# Patient Record
Sex: Female | Born: 1985 | Race: Black or African American | Hispanic: No | Marital: Single | Smoking: Light tobacco smoker
Health system: Southern US, Community
[De-identification: ages and names within clinical notes are randomized; demographics above are authoritative.]

## PROBLEM LIST (undated history)

## (undated) DIAGNOSIS — B009 Herpesviral infection, unspecified: Secondary | ICD-10-CM

## (undated) HISTORY — PX: FOOT SURGERY: SHX648

---

## 2015-10-01 ENCOUNTER — Emergency Department (HOSPITAL_COMMUNITY): Payer: Medicaid Other

## 2015-10-01 ENCOUNTER — Emergency Department (HOSPITAL_COMMUNITY)
Admission: EM | Admit: 2015-10-01 | Discharge: 2015-10-01 | Disposition: A | Discharge status: Home / Self Care | Payer: Medicaid Other | Attending: Emergency Medicine | Admitting: Emergency Medicine

## 2015-10-01 ENCOUNTER — Encounter (HOSPITAL_COMMUNITY): Payer: Self-pay | Admitting: Emergency Medicine

## 2015-10-01 DIAGNOSIS — Z91040 Latex allergy status: Secondary | ICD-10-CM | POA: Insufficient documentation

## 2015-10-01 DIAGNOSIS — R102 Pelvic and perineal pain: Secondary | ICD-10-CM

## 2015-10-01 DIAGNOSIS — F17210 Nicotine dependence, cigarettes, uncomplicated: Secondary | ICD-10-CM | POA: Insufficient documentation

## 2015-10-01 DIAGNOSIS — N939 Abnormal uterine and vaginal bleeding, unspecified: Secondary | ICD-10-CM

## 2015-10-01 DIAGNOSIS — N760 Acute vaginitis: Secondary | ICD-10-CM | POA: Insufficient documentation

## 2015-10-01 DIAGNOSIS — N390 Urinary tract infection, site not specified: Secondary | ICD-10-CM | POA: Diagnosis not present

## 2015-10-01 DIAGNOSIS — B9689 Other specified bacterial agents as the cause of diseases classified elsewhere: Secondary | ICD-10-CM

## 2015-10-01 HISTORY — DX: Herpesviral infection, unspecified: B00.9

## 2015-10-01 LAB — WET PREP, GENITAL
Sperm: NONE SEEN
Trich, Wet Prep: NONE SEEN
WBC, Wet Prep HPF POC: NONE SEEN
Yeast Wet Prep HPF POC: NONE SEEN

## 2015-10-01 LAB — COMPREHENSIVE METABOLIC PANEL
ALBUMIN: 3.6 g/dL (ref 3.5–5.0)
ALK PHOS: 52 U/L (ref 38–126)
ALT: 21 U/L (ref 14–54)
AST: 29 U/L (ref 15–41)
Anion gap: 5 (ref 5–15)
BILIRUBIN TOTAL: 0.5 mg/dL (ref 0.3–1.2)
BUN: 15 mg/dL (ref 6–20)
CALCIUM: 8.4 mg/dL — AB (ref 8.9–10.3)
CO2: 26 mmol/L (ref 22–32)
CREATININE: 0.7 mg/dL (ref 0.44–1.00)
Chloride: 107 mmol/L (ref 101–111)
GFR calc Af Amer: >60 mL/min (ref >60)
GFR calc non Af Amer: >60 mL/min (ref >60)
GLUCOSE: 84 mg/dL (ref 65–99)
POTASSIUM: 3.6 mmol/L (ref 3.5–5.1)
Sodium: 138 mmol/L (ref 135–145)
TOTAL PROTEIN: 6.7 g/dL (ref 6.5–8.1)

## 2015-10-01 LAB — CBC WITH DIFFERENTIAL/PLATELET
BASOS ABS: 0 10*3/uL (ref 0.0–0.1)
BASOS PCT: 0 %
EOS ABS: 0.1 10*3/uL (ref 0.0–0.7)
Eosinophils Relative: 1 %
HEMATOCRIT: 34.7 % — AB (ref 36.0–46.0)
HEMOGLOBIN: 11.6 g/dL — AB (ref 12.0–15.0)
LYMPHS ABS: 1.3 10*3/uL (ref 0.7–4.0)
Lymphocytes Relative: 15 %
MCH: 30.3 pg (ref 26.0–34.0)
MCHC: 33.4 g/dL (ref 30.0–36.0)
MCV: 90.6 fL (ref 78.0–100.0)
Monocytes Absolute: 0.6 10*3/uL (ref 0.1–1.0)
Monocytes Relative: 7 %
NEUTROS PCT: 77 %
Neutro Abs: 6.7 10*3/uL (ref 1.7–7.7)
Platelets: 256 10*3/uL (ref 150–400)
RBC: 3.83 MIL/uL — AB (ref 3.87–5.11)
RDW: 12.9 % (ref 11.5–15.5)
WBC: 8.7 10*3/uL (ref 4.0–10.5)

## 2015-10-01 LAB — URINALYSIS, ROUTINE W REFLEX MICROSCOPIC
Bilirubin Urine: NEGATIVE
GLUCOSE, UA: NEGATIVE mg/dL
KETONES UR: NEGATIVE mg/dL
Nitrite: POSITIVE — AB
PH: 7 (ref 5.0–8.0)
Protein, ur: 100 mg/dL — AB
Specific Gravity, Urine: 1.025 (ref 1.005–1.030)

## 2015-10-01 LAB — URINE MICROSCOPIC-ADD ON

## 2015-10-01 LAB — LIPASE, BLOOD: Lipase: 23 U/L (ref 11–51)

## 2015-10-01 LAB — I-STAT BETA HCG BLOOD, ED (MC, WL, AP ONLY)

## 2015-10-01 MED ORDER — METRONIDAZOLE 500 MG PO TABS: 500.0000 mg | ORAL_TABLET | Freq: Two times a day (BID) | ORAL | Status: DC

## 2015-10-01 MED ORDER — NAPROXEN 500 MG PO TABS: 500.0000 mg | ORAL_TABLET | Freq: Two times a day (BID) | ORAL | Status: DC

## 2015-10-01 MED ORDER — ACETAMINOPHEN 325 MG PO TABS
650.0000 mg | ORAL_TABLET | Freq: Once | ORAL | Status: AC
  Administered 2015-10-01: 650 mg via ORAL
  Filled 2015-10-01: qty 2

## 2015-10-01 MED ORDER — CEPHALEXIN 500 MG PO CAPS: 500.0000 mg | ORAL_CAPSULE | Freq: Two times a day (BID) | ORAL | Status: DC

## 2015-10-01 NOTE — ED Provider Notes (Signed)
CSN: [id number]     Arrival date & time 10/01/15  1022 History   First MD Initiated Contact with Patient 10/01/15 1136     Chief Complaint  Patient presents with  . Vaginal Bleeding  . Pelvic Pain     (Consider location/radiation/quality/duration/timing/severity/associated sxs/prior Treatment) HPI Miranda Stephenson is a 30 y.o. female with PMH significant for Herpes who presents with gradual onset, intermittent, light vaginal bleeding since 12 PM yesterday.  She states she noticed small blood clots and thick clumps whih prompted her visit today.  Associated symptoms include sharp pelvic pain, dysuria, urinary frequency, and generalized weakness.  Denies fever, chills, hematuria, vaginal discharge, dyspareunia, flank pain, N/V/D.  She does not take OCPs.  Tubal ligation 7 years ago.  Sexually active with one partner.  LNMP ended 09/26/15.  Past Medical History  Diagnosis Date  . Herpes    Past Surgical History  Procedure Laterality Date  . Cesarean section    . Foot surgery     No family history on file. Social History  Substance Use Topics  . Smoking status: Light Tobacco Smoker    Types: Cigarettes  . Smokeless tobacco: None  . Alcohol Use: Yes   OB History    No data available     Review of Systems All other systems negative unless otherwise stated in HPI    Allergies  Latex  Home Medications   Prior to Admission medications   Medication Sig Start Date End Date Taking? Authorizing Provider  Multiple Vitamin (MULTIVITAMIN WITH MINERALS) TABS tablet Take 1 tablet by mouth daily.   Yes Historical Provider, MD  naproxen sodium (ANAPROX) 220 MG tablet Take 220-440 mg by mouth 2 (two) times daily as needed (pain).   Yes Historical Provider, MD   BP 111/75 mmHg  Pulse 67  Temp(Src) 98.6 F (37 C) (Oral)  Resp 18  SpO2 100%  LMP 09/26/2015 Physical Exam  Constitutional: She is oriented to person, place, and time. She appears well-developed and well-nourished.   Non-toxic appearance. She does not have a sickly appearance. She does not appear ill.  HENT:  Head: Normocephalic and atraumatic.  Eyes: Conjunctivae are normal. Pupils are equal, round, and reactive to light.  Neck: Normal range of motion. Neck supple.  Cardiovascular: Normal rate and regular rhythm.   Pulmonary/Chest: Effort normal and breath sounds normal. No accessory muscle usage or stridor. No respiratory distress. She has no wheezes. She has no rhonchi. She has no rales.  Abdominal: Soft. Bowel sounds are normal. She exhibits no distension. There is tenderness in the suprapubic area. There is no rigidity, no rebound, no guarding and no CVA tenderness.  Genitourinary: Vagina normal and uterus normal. There is no rash, tenderness or lesion on the right labia. There is no rash, tenderness or lesion on the left labia. Cervix exhibits discharge (white chunky). Cervix exhibits no motion tenderness. Right adnexum displays no tenderness. Left adnexum displays no tenderness.  Musculoskeletal: Normal range of motion.  Lymphadenopathy:    She has no cervical adenopathy.  Neurological: She is alert and oriented to person, place, and time.  Speech clear without dysarthria.  Skin: Skin is warm and dry.  Psychiatric: She has a normal mood and affect. Her behavior is normal.    ED Course  Procedures (including critical care time) Labs Review Labs Reviewed  WET PREP, GENITAL - Abnormal; Notable for the following:    Clue Cells Wet Prep HPF POC PRESENT (*)    All other components within normal  limits  CBC WITH DIFFERENTIAL/PLATELET - Abnormal; Notable for the following:    RBC 3.83 (*)    Hemoglobin 11.6 (*)    HCT 34.7 (*)    All other components within normal limits  COMPREHENSIVE METABOLIC PANEL - Abnormal; Notable for the following:    Calcium 8.4 (*)    All other components within normal limits  URINALYSIS, ROUTINE W REFLEX MICROSCOPIC (NOT AT [facility]) - Abnormal; Notable for the following:     APPearance CLOUDY (*)    Hgb urine dipstick LARGE (*)    Protein, ur 100 (*)    Nitrite POSITIVE (*)    Leukocytes, UA SMALL (*)    All other components within normal limits  URINE MICROSCOPIC-ADD ON - Abnormal; Notable for the following:    Squamous Epithelial / LPF 0-5 (*)    Bacteria, UA MANY (*)    All other components within normal limits  URINE CULTURE  LIPASE, BLOOD  RPR  HIV ANTIBODY (ROUTINE TESTING)  I-STAT BETA HCG BLOOD, ED ([facility], [facility], [facility] ONLY)  GC/CHLAMYDIA PROBE AMP ([place]) NOT AT [facility]    Imaging Review [place] Transvaginal Non-ob  10/01/2015  CLINICAL DATA:  Pelvic pain since yesterday. Evaluate for ovarian torsion. EXAM: TRANSABDOMINAL AND TRANSVAGINAL ULTRASOUND OF PELVIS DOPPLER ULTRASOUND OF OVARIES TECHNIQUE: Both transabdominal and transvaginal ultrasound examinations of the pelvis were performed. Transabdominal technique was performed for global imaging of the pelvis including uterus, ovaries, adnexal regions, and pelvic cul-de-sac. It was necessary to proceed with endovaginal exam following the transabdominal exam to visualize the ovaries. Color and duplex Doppler ultrasound was utilized to evaluate blood flow to the ovaries. COMPARISON:  None. FINDINGS: Uterus Measurements: 8.2 x 4.3 x 5.3 cm. No fibroids or other mass visualized. Endometrium Thickness: 0.9 cm.  No focal abnormality visualized. Right ovary Measurements: 2.7 x 1.7 x 2.2 cm. Normal appearance/no adnexal mass. Left ovary Measurements: 3.8 x 2.2 x 3.1 cm. Normal appearance/no adnexal mass. Pulsed Doppler evaluation of both ovaries demonstrates normal low-resistance arterial and venous waveforms. Other findings No abnormal free fluid. IMPRESSION: Normal pelvic ultrasound.  Negative for ovarian torsion. Electronically Signed   By: Richarda OverlieAdam  Henn M.D.   On: 10/01/2015 14:06   [place] Pelvis Complete  10/01/2015  CLINICAL DATA:  Pelvic pain since yesterday. Evaluate for ovarian torsion. EXAM: TRANSABDOMINAL AND  TRANSVAGINAL ULTRASOUND OF PELVIS DOPPLER ULTRASOUND OF OVARIES TECHNIQUE: Both transabdominal and transvaginal ultrasound examinations of the pelvis were performed. Transabdominal technique was performed for global imaging of the pelvis including uterus, ovaries, adnexal regions, and pelvic cul-de-sac. It was necessary to proceed with endovaginal exam following the transabdominal exam to visualize the ovaries. Color and duplex Doppler ultrasound was utilized to evaluate blood flow to the ovaries. COMPARISON:  None. FINDINGS: Uterus Measurements: 8.2 x 4.3 x 5.3 cm. No fibroids or other mass visualized. Endometrium Thickness: 0.9 cm.  No focal abnormality visualized. Right ovary Measurements: 2.7 x 1.7 x 2.2 cm. Normal appearance/no adnexal mass. Left ovary Measurements: 3.8 x 2.2 x 3.1 cm. Normal appearance/no adnexal mass. Pulsed Doppler evaluation of both ovaries demonstrates normal low-resistance arterial and venous waveforms. Other findings No abnormal free fluid. IMPRESSION: Normal pelvic ultrasound.  Negative for ovarian torsion. Electronically Signed   By: Richarda OverlieAdam  Henn M.D.   On: 10/01/2015 14:06   [place] Art/ven Flow Abd Pelv Doppler  10/01/2015  CLINICAL DATA:  Pelvic pain since yesterday. Evaluate for ovarian torsion. EXAM: TRANSABDOMINAL AND TRANSVAGINAL ULTRASOUND OF PELVIS DOPPLER ULTRASOUND OF OVARIES TECHNIQUE: Both transabdominal and  transvaginal ultrasound examinations of the pelvis were performed. Transabdominal technique was performed for global imaging of the pelvis including uterus, ovaries, adnexal regions, and pelvic cul-de-sac. It was necessary to proceed with endovaginal exam following the transabdominal exam to visualize the ovaries. Color and duplex Doppler ultrasound was utilized to evaluate blood flow to the ovaries. COMPARISON:  None. FINDINGS: Uterus Measurements: 8.2 x 4.3 x 5.3 cm. No fibroids or other mass visualized. Endometrium Thickness: 0.9 cm.  No focal abnormality visualized.  Right ovary Measurements: 2.7 x 1.7 x 2.2 cm. Normal appearance/no adnexal mass. Left ovary Measurements: 3.8 x 2.2 x 3.1 cm. Normal appearance/no adnexal mass. Pulsed Doppler evaluation of both ovaries demonstrates normal low-resistance arterial and venous waveforms. Other findings No abnormal free fluid. IMPRESSION: Normal pelvic ultrasound.  Negative for ovarian torsion. Electronically Signed   By: Richarda Overlie M.D.   On: 10/01/2015 14:06   I have personally reviewed and evaluated these images and lab results as part of my medical decision-making.   EKG Interpretation None      MDM   Final diagnoses:  Pelvic pain in female  UTI (lower urinary tract infection)  BV (bacterial vaginosis)  Vaginal bleeding   Patient presents with vaginal bleeding and pelvic pain.  VSS, NAD.  Associated symptoms include dysuria, urinary frequency.  No fever, N/V/D.  On exam, patient appears well, non-toxic or septic.  Heart RRR, lungs CTAB, abdomen soft and benign with mild suprapubic tenderness.  No rebound, guarding, or rigidity.  No CMT or adnexal tenderness.  There is white chunky cervical discharge.  Wet prep shows clue cells. She is not pregnant. Hemoglobin 11.6. No leukocytosis. CMP without acute abnormalities. UA shows large hemoglobin, 6-30 rbc's, many bacteria, nitrite positive. Urine culture sent. HIV, RPR, GC/Chlamydia obtained.  Ultrasound unremarkable, no evidence of ovarian torsion. Discharge home with Keflex and Flagyl.  Follow up [facility].  Discussed return precautions.  Patient agrees and acknowledges the above plan for discharge.   Case has been discussed with Dr. Jeraldine Loots who agrees with the above plan for discharge.         Cheri Fowler, PA-C 10/01/15 1431  Gerhard Munch, MD 10/01/15 1538

## 2015-10-01 NOTE — ED Notes (Signed)
Pt c/o vaginal bleeding and pain since last night. Pt states she has been off her period for 5 days when the bleeding started. States there were "lots of small blood clots and it was thick like mucus."

## 2015-10-01 NOTE — ED Notes (Signed)
Pt transported to [facility]

## 2015-10-01 NOTE — Discharge Instructions (Signed)
Abnormal Uterine Bleeding °Abnormal uterine bleeding can affect women at various stages in life, including teenagers, women in their reproductive years, pregnant women, and women who have reached menopause. Several kinds of uterine bleeding are considered abnormal, including: °· Bleeding or spotting between periods.   °· Bleeding after sexual intercourse.   °· Bleeding that is heavier or more than normal.   °· Periods that last longer than usual. °· Bleeding after menopause.   °Many cases of abnormal uterine bleeding are minor and simple to treat, while others are more serious. Any type of abnormal bleeding should be evaluated by your health care provider. Treatment will depend on the cause of the bleeding. °HOME CARE INSTRUCTIONS °Monitor your condition for any changes. The following actions may help to alleviate any discomfort you are experiencing: °· Avoid the use of tampons and douches as directed by your health care provider. °· Change your pads frequently. °You should get regular pelvic exams and Pap tests. Keep all follow-up appointments for diagnostic tests as directed by your health care provider.  °SEEK MEDICAL CARE IF:  °· Your bleeding lasts more than 1 week.   °· You feel dizzy at times.   °SEEK IMMEDIATE MEDICAL CARE IF:  °· You pass out.   °· You are changing pads every 15 to 30 minutes.   °· You have abdominal pain. °· You have a fever.   °· You become sweaty or weak.   °· You are passing large blood clots from the vagina.   °· You start to feel nauseous and vomit. °MAKE SURE YOU:  °· Understand these instructions. °· Will watch your condition. °· Will get help right away if you are not doing well or get worse. °  °This information is not intended to replace advice given to you by your health care provider. Make sure you discuss any questions you have with your health care provider. °  °Document Released: 04/03/2005 Document Revised: 04/08/2013 Document Reviewed: 10/31/2012 °Elsevier Interactive  Patient Education ©2016 [employer] ° °Urinary Tract Infection °Urinary tract infections (UTIs) can develop anywhere along your urinary tract. Your urinary tract is your body's drainage system for removing wastes and extra water. Your urinary tract includes two kidneys, two ureters, a bladder, and a urethra. Your kidneys are a pair of bean-shaped organs. Each kidney is about the size of your fist. They are located below your ribs, one on each side of your spine. °CAUSES °Infections are caused by microbes, which are microscopic organisms, including fungi, viruses, and bacteria. These organisms are so small that they can only be seen through a microscope. Bacteria are the microbes that most commonly cause UTIs. °SYMPTOMS  °Symptoms of UTIs may vary by age and gender of the patient and by the location of the infection. Symptoms in young women typically include a frequent and intense urge to urinate and a painful, burning feeling in the bladder or urethra during urination. Older women and men are more likely to be tired, shaky, and weak and have muscle aches and abdominal pain. A fever may mean the infection is in your kidneys. Other symptoms of a kidney infection include pain in your back or sides below the ribs, nausea, and vomiting. °DIAGNOSIS °To diagnose a UTI, your caregiver will ask you about your symptoms. Your caregiver will also ask you to provide a urine sample. The urine sample will be tested for bacteria and white blood cells. White blood cells are made by your body to help fight infection. °TREATMENT  °Typically, UTIs can be treated with medication. Because most UTIs   are caused by a bacterial infection, they usually can be treated with the use of antibiotics. The choice of antibiotic and length of treatment depend on your symptoms and the type of bacteria causing your infection. °HOME CARE INSTRUCTIONS °· If you were prescribed antibiotics, take them exactly as your caregiver instructs you. Finish the  medication even if you feel better after you have only taken some of the medication. °· Drink enough water and fluids to keep your urine clear or pale yellow. °· Avoid caffeine, tea, and carbonated beverages. They tend to irritate your bladder. °· Empty your bladder often. Avoid holding urine for long periods of time. °· Empty your bladder before and after sexual intercourse. °· After a bowel movement, women should cleanse from front to back. Use each tissue only once. °SEEK MEDICAL CARE IF:  °· You have back pain. °· You develop a fever. °· Your symptoms do not begin to resolve within 3 days. °SEEK IMMEDIATE MEDICAL CARE IF:  °· You have severe back pain or lower abdominal pain. °· You develop chills. °· You have nausea or vomiting. °· You have continued burning or discomfort with urination. °MAKE SURE YOU:  °· Understand these instructions. °· Will watch your condition. °· Will get help right away if you are not doing well or get worse. °  °This information is not intended to replace advice given to you by your health care provider. Make sure you discuss any questions you have with your health care provider. °  °Document Released: 01/11/2005 Document Revised: 12/23/2014 Document Reviewed: 05/12/2011 °Elsevier Interactive Patient Education ©2016 Elsevier Inc. ° °

## 2015-10-02 LAB — RPR: RPR: NONREACTIVE

## 2015-10-02 LAB — HIV ANTIBODY (ROUTINE TESTING W REFLEX): HIV Screen 4th Generation wRfx: NONREACTIVE

## 2015-10-04 LAB — GC/CHLAMYDIA PROBE AMP (~~LOC~~) NOT AT ARMC
Chlamydia: NEGATIVE
NEISSERIA GONORRHEA: NEGATIVE

## 2015-10-04 LAB — URINE CULTURE

## 2015-10-05 ENCOUNTER — Telehealth (HOSPITAL_BASED_OUTPATIENT_CLINIC_OR_DEPARTMENT_OTHER): Payer: Self-pay | Admitting: Emergency Medicine

## 2015-10-05 NOTE — Telephone Encounter (Signed)
Post ED Visit - Positive Culture Follow-up  Culture report reviewed by antimicrobial stewardship pharmacist:  []  Miranda Stephenson, Pharm.D. []  Miranda Stephenson, Pharm.D., BCPS []  Miranda Stephenson, Pharm.D. []  Miranda Stephenson, Pharm.D., BCPS []  Miranda Stephenson, [place] BCPS, AAHIVP []  Miranda Stephenson, Pharm.D., BCPS, AAHIVP [x]  Miranda Stephenson, Pharm.D. []  Miranda Stephenson, [address]  Positive urine  culture Treated with metronidazole and cephalexin, organism sensitive to the same and no further patient follow-up is required at this time.  Miranda Stephenson, Miranda Stephenson 10/05/2015, 9:48 AM

## 2015-10-25 ENCOUNTER — Encounter (HOSPITAL_COMMUNITY): Payer: Self-pay | Admitting: Emergency Medicine

## 2015-10-25 ENCOUNTER — Ambulatory Visit (HOSPITAL_COMMUNITY)
Admission: EM | Admit: 2015-10-25 | Discharge: 2015-10-25 | Disposition: A | Discharge status: Home / Self Care | Payer: Medicaid Other | Attending: Emergency Medicine | Admitting: Emergency Medicine

## 2015-10-25 DIAGNOSIS — K0889 Other specified disorders of teeth and supporting structures: Secondary | ICD-10-CM | POA: Diagnosis not present

## 2015-10-25 MED ORDER — HYDROCODONE-ACETAMINOPHEN 5-325 MG PO TABS: 1.0000 | ORAL_TABLET | ORAL | Status: DC | PRN

## 2015-10-25 MED ORDER — AMOXICILLIN 500 MG PO CAPS
1000.0000 mg | ORAL_CAPSULE | Freq: Two times a day (BID) | ORAL | Status: DC
Start: 2015-10-25 — End: 2017-10-01

## 2015-10-25 NOTE — ED Provider Notes (Signed)
CSN: [id number]     Arrival date & time 10/25/15  1024 History   First MD Initiated Contact with Patient 10/25/15 1044     Chief Complaint  Patient presents with  . Dental Pain   (Consider location/radiation/quality/duration/timing/severity/associated sxs/prior Treatment) HPI Comments: 30 year old female woke up with a toothache this morning. Pain radiates to the right upper jaw and ear. Denies actual ear pain. She states she has had a problem with molars in the right upper jaw in the past and [place]. She was told at that time that eventually she would be having pain in this area in the future. Denies fever or chills, sore throat or fevers.   Past Medical History  Diagnosis Date  . Herpes    Past Surgical History  Procedure Laterality Date  . Cesarean section    . Foot surgery     History reviewed. No pertinent family history. Social History  Substance Use Topics  . Smoking status: Light Tobacco Smoker    Types: Cigarettes  . Smokeless tobacco: None  . Alcohol Use: Yes   OB History    No data available     Review of Systems  Constitutional: Negative.   HENT: Positive for dental problem. Negative for ear pain, mouth sores and sore throat.   Eyes: Negative.   Respiratory: Negative.   Neurological: Negative.     Allergies  Latex  Home Medications   Prior to Admission medications   Medication Sig Start Date End Date Taking? Authorizing Provider  naproxen (NAPROSYN) 500 MG tablet Take 1 tablet (500 mg total) by mouth 2 (two) times daily. 10/01/15  Yes Kayla Rose, PA-C  amoxicillin (AMOXIL) 500 MG capsule Take 2 capsules (1,000 mg total) by mouth 2 (two) times daily. 10/25/15   Hayden Rasmussen, NP  cephALEXin (KEFLEX) 500 MG capsule Take 1 capsule (500 mg total) by mouth 2 (two) times daily. 10/01/15   Cheri Fowler, PA-C  HYDROcodone-acetaminophen (NORCO/VICODIN) 5-325 MG tablet Take 1 tablet by mouth every 4 (four) hours as needed. 10/25/15    Hayden Rasmussen, NP  metroNIDAZOLE (FLAGYL) 500 MG tablet Take 1 tablet (500 mg total) by mouth 2 (two) times daily. 10/01/15   Cheri Fowler, PA-C  Multiple Vitamin (MULTIVITAMIN WITH MINERALS) TABS tablet Take 1 tablet by mouth daily.    Historical Provider, MD  naproxen sodium (ANAPROX) 220 MG tablet Take 220-440 mg by mouth 2 (two) times daily as needed (pain).    Historical Provider, MD   Meds Ordered and Administered this Visit  Medications - No data to display  BP 100/61 mmHg  Pulse 67  Temp(Src) 98.7 F (37.1 C) (Oral)  Resp 18  SpO2 98%  LMP 09/26/2015 No data found.   Physical Exam  Constitutional: She is oriented to person, place, and time. She appears well-developed and well-nourished. No distress.  HENT:  Mouth/Throat: Oropharynx is clear and moist. No oropharyngeal exudate.  Fair to good Engineer, manufacturing systems. There is tenderness to the right upper second molar. No obvious signs of infection erythema or swelling.  Eyes: Conjunctivae and EOM are normal.  Neck: Neck supple.  Musculoskeletal: Normal range of motion.  Neurological: She is alert and oriented to person, place, and time. No cranial nerve deficit.  Skin: Skin is warm and dry.  Psychiatric: She has a normal mood and affect.  Nursing note and vitals reviewed.   ED Course  Procedures (including critical care time)  Labs Review Labs Reviewed - No  data to display  Imaging Review No results found.   Visual Acuity Review  Right Eye Distance:   Left Eye Distance:   Bilateral Distance:    Right Eye Near:   Left Eye Near:    Bilateral Near:         MDM   1. Pain, dental    Meds ordered this encounter  Medications  . amoxicillin (AMOXIL) 500 MG capsule    Sig: Take 2 capsules (1,000 mg total) by mouth 2 (two) times daily.    Dispense:  28 capsule    Refill:  0    Order Specific Question:  Supervising Provider    Answer:  Charm RingsHONIG, ERIN J [id number]]  . HYDROcodone-acetaminophen (NORCO/VICODIN) 5-325 MG tablet     Sig: Take 1 tablet by mouth every 4 (four) hours as needed.    Dispense:  15 tablet    Refill:  0    Order Specific Question:  Supervising Provider    Answer:  Charm RingsHONIG, ERIN J [[id number]]   Referred to dentist and PCP. Telephone numbers given to call in assistance with PCP. Resources for dentist given.    Hayden Rasmussenavid Jolaine Fryberger, NP 10/25/15 1056

## 2015-10-25 NOTE — ED Notes (Signed)
[facility] with a complaint of dental pain and an ear ache that started at 0600 this date.

## 2016-05-07 ENCOUNTER — Emergency Department (HOSPITAL_COMMUNITY)
Admission: EM | Admit: 2016-05-07 | Discharge: 2016-05-07 | Disposition: A | Discharge status: Home / Self Care | Payer: Medicaid Other | Attending: Emergency Medicine | Admitting: Emergency Medicine

## 2016-05-07 ENCOUNTER — Encounter (HOSPITAL_COMMUNITY): Payer: Self-pay | Admitting: Emergency Medicine

## 2016-05-07 DIAGNOSIS — J069 Acute upper respiratory infection, unspecified: Secondary | ICD-10-CM

## 2016-05-07 DIAGNOSIS — R05 Cough: Secondary | ICD-10-CM | POA: Diagnosis present

## 2016-05-07 DIAGNOSIS — R6889 Other general symptoms and signs: Secondary | ICD-10-CM

## 2016-05-07 DIAGNOSIS — Z91040 Latex allergy status: Secondary | ICD-10-CM | POA: Insufficient documentation

## 2016-05-07 DIAGNOSIS — F17210 Nicotine dependence, cigarettes, uncomplicated: Secondary | ICD-10-CM | POA: Insufficient documentation

## 2016-05-07 DIAGNOSIS — Z79899 Other long term (current) drug therapy: Secondary | ICD-10-CM | POA: Diagnosis not present

## 2016-05-07 MED ORDER — IBUPROFEN 600 MG PO TABS: 600.0000 mg | ORAL_TABLET | Freq: Four times a day (QID) | ORAL | 0 refills | Status: DC | PRN

## 2016-05-07 MED ORDER — ACETAMINOPHEN 500 MG PO TABS: 500.0000 mg | ORAL_TABLET | Freq: Four times a day (QID) | ORAL | 0 refills | Status: DC | PRN

## 2016-05-07 MED ORDER — BENZONATATE 100 MG PO CAPS: 100.0000 mg | ORAL_CAPSULE | Freq: Three times a day (TID) | ORAL | 0 refills | Status: DC

## 2016-05-07 MED ORDER — CETIRIZINE-PSEUDOEPHEDRINE ER 5-120 MG PO TB12: 1.0000 | ORAL_TABLET | Freq: Two times a day (BID) | ORAL | 0 refills | Status: DC

## 2016-05-07 NOTE — ED Triage Notes (Signed)
Pt from home with complaints of sneezing, congestion, cough, and generalized body aches that began Friday. Pt is not febrile nor is she tachycardic at time of assessment. Pt states she coughs up a scant amount of light colored sputum in the morning.

## 2016-05-07 NOTE — Discharge Instructions (Signed)
Medications: Tessalon, Zyrtec-D, ibuprofen, Tylenol  Treatment: Take Tessalon every 8 hours as needed for cough. Take Zyrtec-D twice daily for nasal congestion. You can alternate ibuprofen and Tylenol as prescribed or just choose one for your body aches and any fever. Make sure to drink plenty of water and get plenty of rest.  Follow-up: Please return to the emergency department if you develop and new or worsening symptoms such as chest pain, shortness of breath, fever over 100.4 that is not resolved with medicine or lasts more than 3 days, or any other concerning symptoms.

## 2016-05-07 NOTE — ED Provider Notes (Signed)
[facility]-EMERGENCY DEPT Provider Note   CSN: [id number] Arrival date & time: 05/07/16  1503  By signing my name below, I, Orpah Cobb, attest that this documentation has been prepared under the direction and in the presence of Buel Ream, PA-C. Electronically Signed: Orpah Cobb , ED Scribe. 05/01/16. 4:20 PM.   History   Chief Complaint Chief Complaint  Patient presents with  . Nasal Congestion  . Cough  . Generalized Body Aches    HPI  Miranda Stephenson is a 31 y.o. female who presents to the Emergency Department complaining of mild to moderate URI symptoms with sudden onset x2 days. Pt states that yesterday she developed a productive cough with associated sore throat. She states that she also had a fever that has since decreased. Pt reports congestion, sneezing and myalgias. She reports a single episode of vomiting yesterday that has since resolved. Pt has taken Robitussin and a breathing treatment with mild relief. She denies ear pain, eye pain at the moment, nausea, vomiting today, chest pain, or SOB.  The history is provided by the patient. No language interpreter was used.    Past Medical History:  Diagnosis Date  . Herpes     There are no active problems to display for this patient.   Past Surgical History:  Procedure Laterality Date  . CESAREAN SECTION    . FOOT SURGERY      OB History    No data available       Home Medications    Prior to Admission medications   Medication Sig Start Date End Date Taking? Authorizing Provider  acetaminophen (TYLENOL) 500 MG tablet Take 1 tablet (500 mg total) by mouth every 6 (six) hours as needed. 05/07/16   Emi Holes, PA-C  amoxicillin (AMOXIL) 500 MG capsule Take 2 capsules (1,000 mg total) by mouth 2 (two) times daily. 10/25/15   Hayden Rasmussen, NP  benzonatate (TESSALON) 100 MG capsule Take 1 capsule (100 mg total) by mouth every 8 (eight) hours. 05/07/16   Emi Holes, PA-C  cephALEXin (KEFLEX) 500 MG  capsule Take 1 capsule (500 mg total) by mouth 2 (two) times daily. 10/01/15   Kayla Rose, PA-C  cetirizine-pseudoephedrine (ZYRTEC-D) 5-120 MG tablet Take 1 tablet by mouth 2 (two) times daily. 05/07/16   Emi Holes, PA-C  HYDROcodone-acetaminophen (NORCO/VICODIN) 5-325 MG tablet Take 1 tablet by mouth every 4 (four) hours as needed. 10/25/15   Hayden Rasmussen, NP  ibuprofen (ADVIL,MOTRIN) 600 MG tablet Take 1 tablet (600 mg total) by mouth every 6 (six) hours as needed. 05/07/16   Emi Holes, PA-C  metroNIDAZOLE (FLAGYL) 500 MG tablet Take 1 tablet (500 mg total) by mouth 2 (two) times daily. 10/01/15   Cheri Fowler, PA-C  Multiple Vitamin (MULTIVITAMIN WITH MINERALS) TABS tablet Take 1 tablet by mouth daily.    Historical Provider, MD  naproxen (NAPROSYN) 500 MG tablet Take 1 tablet (500 mg total) by mouth 2 (two) times daily. 10/01/15   Cheri Fowler, PA-C  naproxen sodium (ANAPROX) 220 MG tablet Take 220-440 mg by mouth 2 (two) times daily as needed (pain).    Historical Provider, MD    Family History No family history on file.  Social History Social History  Substance Use Topics  . Smoking status: Light Tobacco Smoker    Types: Cigarettes  . Smokeless tobacco: Never Used  . Alcohol use Yes     Allergies   Latex   Review of Systems Review of Systems  Constitutional:  Negative for chills and fever.  HENT: Positive for congestion, sneezing and sore throat. Negative for ear pain and facial swelling.   Eyes: Negative for pain.  Respiratory: Positive for cough. Negative for shortness of breath.   Cardiovascular: Negative for chest pain.  Gastrointestinal: Negative for abdominal pain, nausea and vomiting.  Genitourinary: Negative for dysuria.  Musculoskeletal: Positive for myalgias. Negative for back pain.  Skin: Negative for rash and wound.  Neurological: Negative for headaches.  Psychiatric/Behavioral: The patient is not nervous/anxious.      Physical Exam Updated Vital  Signs BP 115/83 (BP Location: Left Arm)   Pulse 96   Temp 98.4 F (36.9 C) (Oral)   Resp 16   Ht 5\' 2"  (1.575 m)   Wt 54.4 kg   SpO2 100%   BMI 21.95 kg/m   Physical Exam  Constitutional: She appears well-developed and well-nourished. No distress.  HENT:  Head: Normocephalic and atraumatic.  Mouth/Throat: Oropharynx is clear and moist. No oropharyngeal exudate.  Eyes: Conjunctivae are normal. Pupils are equal, round, and reactive to light. Right eye exhibits no discharge. Left eye exhibits no discharge. No scleral icterus.  Neck: Normal range of motion. Neck supple. No thyromegaly present.  Cardiovascular: Normal rate, regular rhythm, normal heart sounds and intact distal pulses.  Exam reveals no gallop and no friction rub.   No murmur heard. Pulmonary/Chest: Effort normal and breath sounds normal. No stridor. No respiratory distress. She has no wheezes. She has no rales.  Abdominal: Soft. Bowel sounds are normal. She exhibits no distension. There is no tenderness. There is no rebound and no guarding.  Musculoskeletal: She exhibits no edema.  Lymphadenopathy:    She has no cervical adenopathy.  Neurological: She is alert. Coordination normal.  Skin: Skin is warm and dry. No rash noted. She is not diaphoretic. No pallor.  Psychiatric: She has a normal mood and affect.  Nursing note and vitals reviewed.    ED Treatments / Results   DIAGNOSTIC STUDIES: Oxygen Saturation is 100% on RA, normal by my interpretation.   COORDINATION OF CARE: 6:11 PM-Discussed next steps with pt. Pt verbalized understanding and is agreeable with the plan.    Labs (all labs ordered are listed, but only abnormal results are displayed) Labs Reviewed - No data to display  EKG  EKG Interpretation None       Radiology No results found.  Procedures Procedures (including critical care time)  Medications Ordered in ED Medications - No data to display   Initial Impression / Assessment  and Plan / ED Course  I have reviewed the triage vital signs and the nursing notes.  Pertinent labs & imaging results that were available during my care of the patient were reviewed by me and considered in my medical decision making (see chart for details).     Patient with symptoms consistent with influenza.  Vitals are stable, low-grade fever.  No signs of dehydration, tolerating PO's.  Lungs are clear. Due to patient's presentation and physical exam a chest x-ray was not ordered bc likely diagnosis of flu.  Discussed the cost versus benefit of Tamiflu treatment with the patient.  Patient declines Tamiflu and would just like symptomatic treatment. Patient will be discharged with instructions to orally hydrate, rest, and use over-the-counter medications such as anti-inflammatories ibuprofen and Aleve for muscle aches and Tylenol for fever. Also discharged with Tessalon and Zyrtec-D for cough and nasal congestion.  Patient vitals stable throughout ED course and discharged in satisfactory condition.  Final Clinical Impressions(s) / ED Diagnoses   Final diagnoses:  Flu-like symptoms  Upper respiratory tract infection, unspecified type    New Prescriptions New Prescriptions   ACETAMINOPHEN (TYLENOL) 500 MG TABLET    Take 1 tablet (500 mg total) by mouth every 6 (six) hours as needed.   BENZONATATE (TESSALON) 100 MG CAPSULE    Take 1 capsule (100 mg total) by mouth every 8 (eight) hours.   CETIRIZINE-PSEUDOEPHEDRINE (ZYRTEC-D) 5-120 MG TABLET    Take 1 tablet by mouth 2 (two) times daily.   IBUPROFEN (ADVIL,MOTRIN) 600 MG TABLET    Take 1 tablet (600 mg total) by mouth every 6 (six) hours as needed.   I personally performed the services described in this documentation, which was scribed in my presence. The recorded information has been reviewed and is accurate.     Emi Holeslexandra M Janeene Sand, PA-C 05/07/16 1811    Linwood DibblesJon Knapp, MD 05/10/16 2020

## 2016-07-18 IMAGING — US US TRANSVAGINAL NON-OB
1 series · 14 of 25 positions shown · non-contrast
Comparison: None.

CLINICAL DATA: Pelvic pain since yesterday. Evaluate for ovarian
torsion.

EXAM:
TRANSABDOMINAL AND TRANSVAGINAL ULTRASOUND OF PELVIS
DOPPLER ULTRASOUND OF OVARIES
TECHNIQUE: Both transabdominal and transvaginal ultrasound examinations of the
pelvis were performed. Transabdominal technique was performed for
global imaging of the pelvis including uterus, ovaries, adnexal
regions, and pelvic cul-de-sac.
It was necessary to proceed with endovaginal exam following the
transabdominal exam to visualize the ovaries. Color and duplex
Doppler ultrasound was utilized to evaluate blood flow to the
ovaries.

[Series 1: us transvaginal non-ob · 0.22mm/px · 58 acquisitions, 14 frames shown]
[im 1/58]
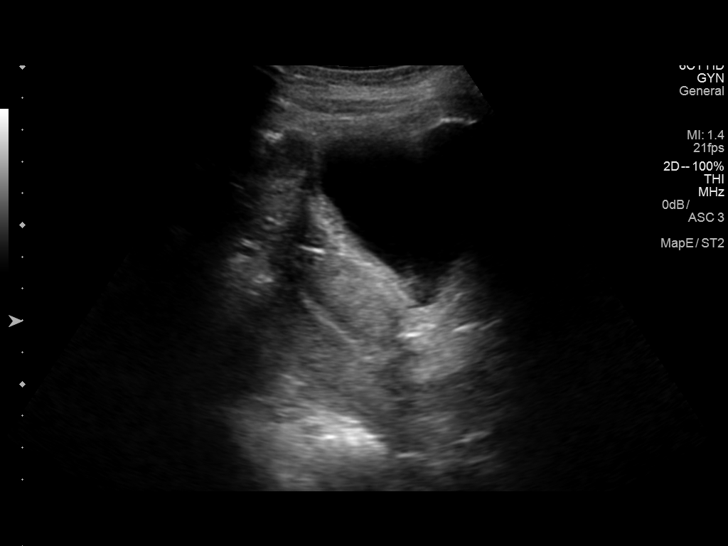
[im 5/58]
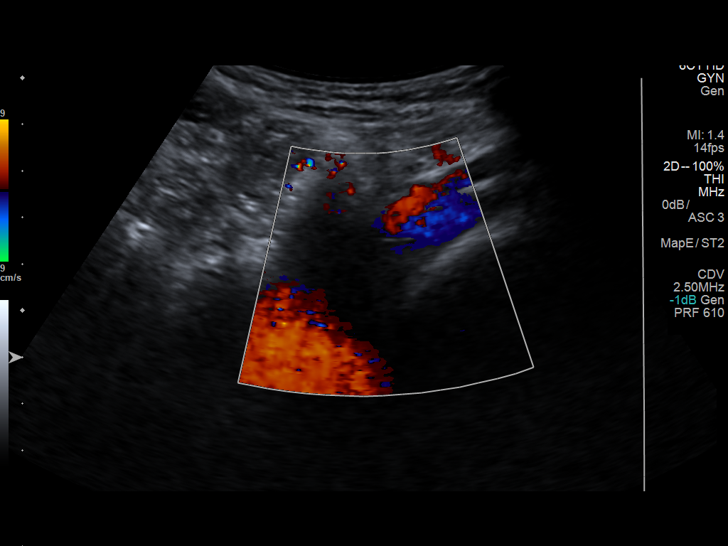
[im 10/58]
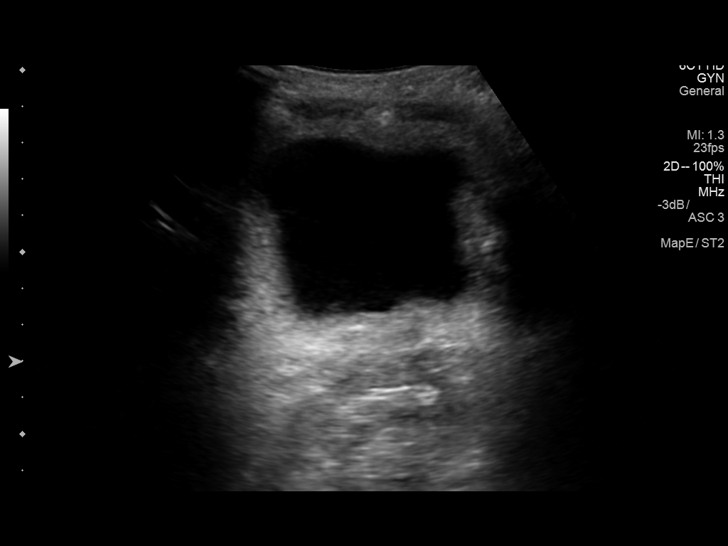
[im 15/58]
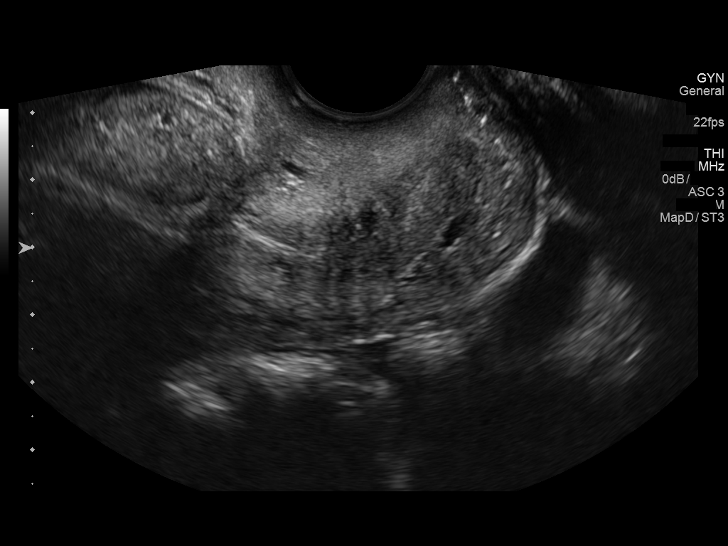
[im 20/58]
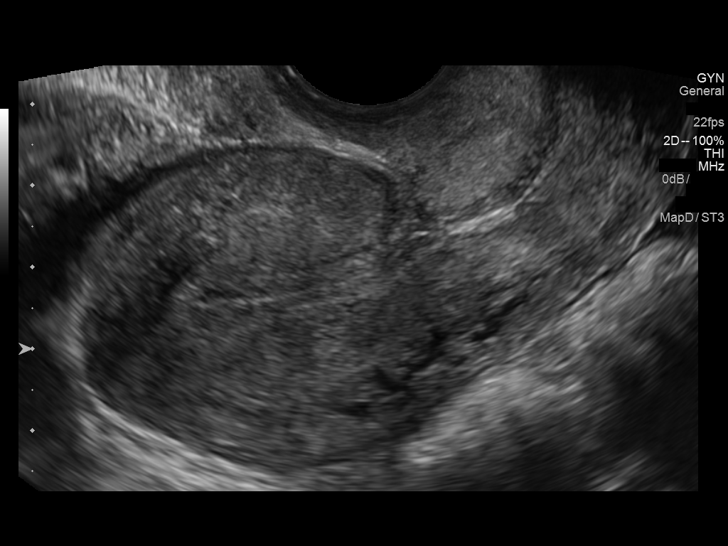
[im 22/58]
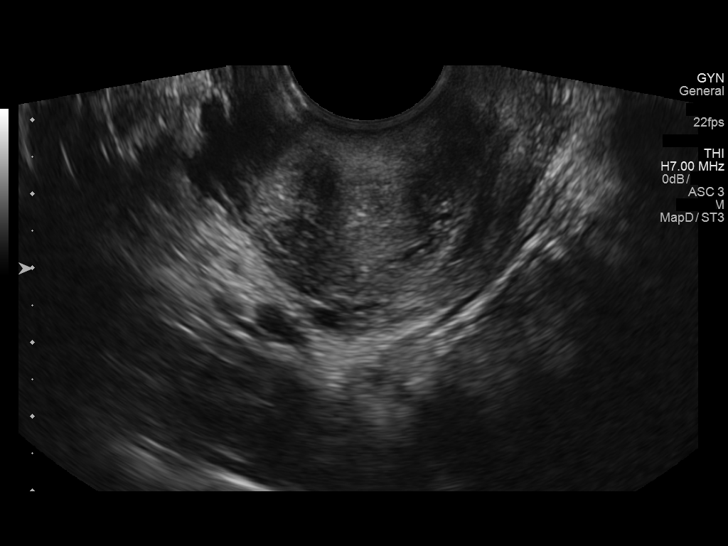
[im 27/58]
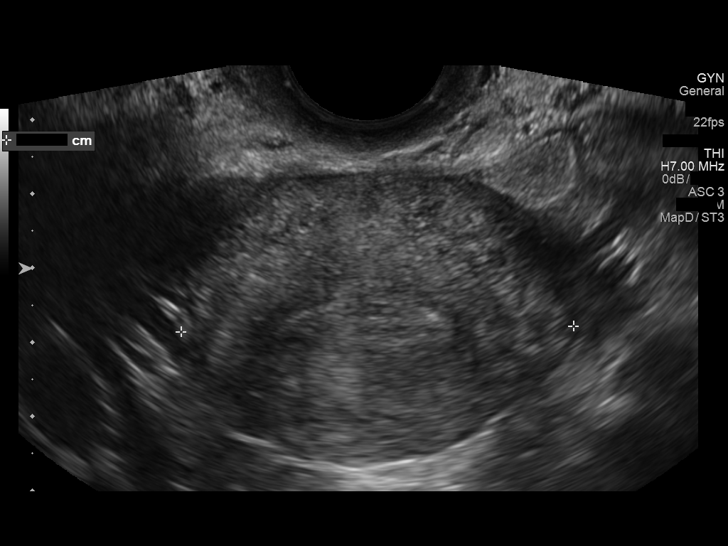
[im 31/58]
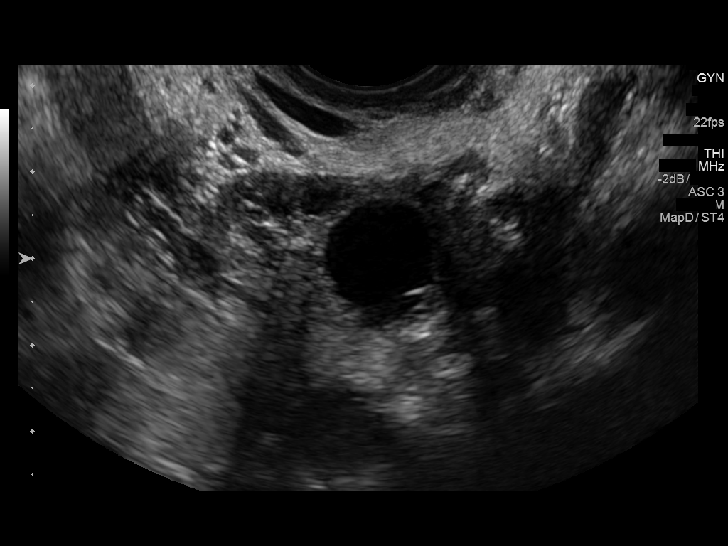
[im 36/58]
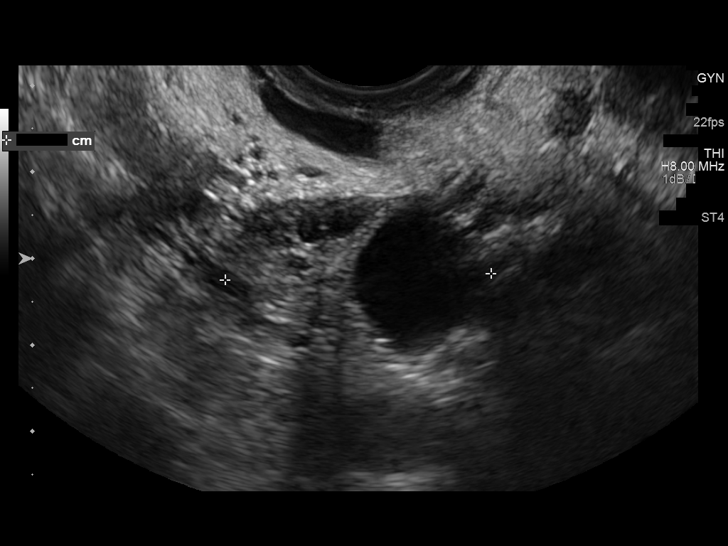
[im 39/58]
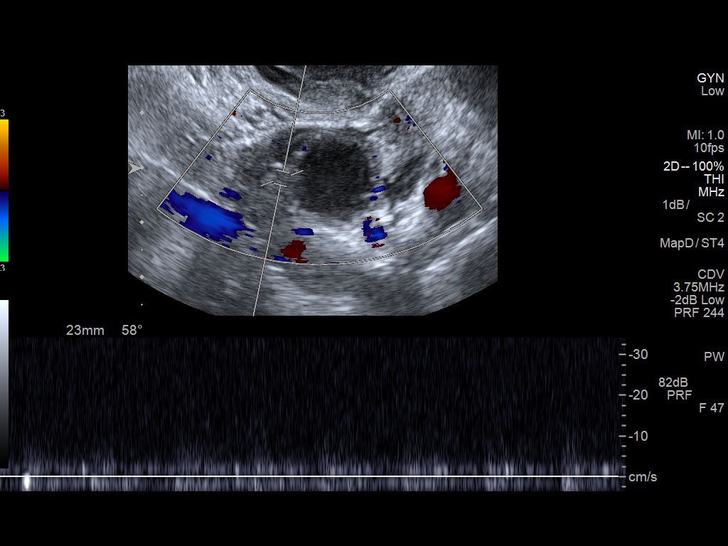
[im 43/58]
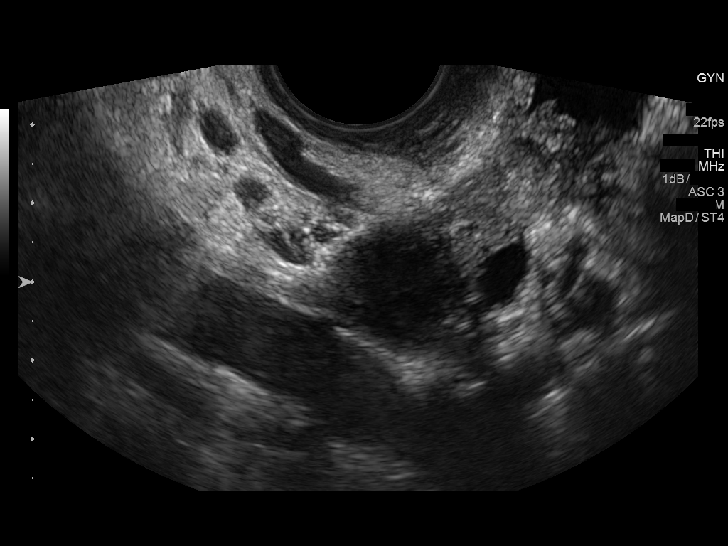
[im 48/58]
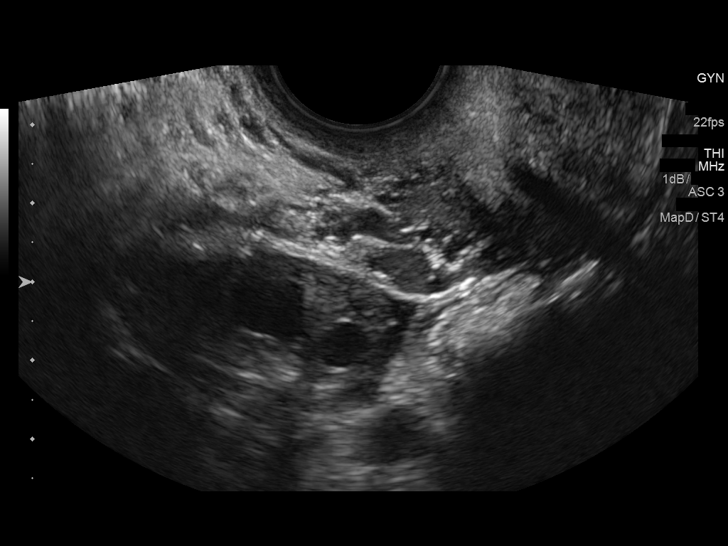
[im 53/58]
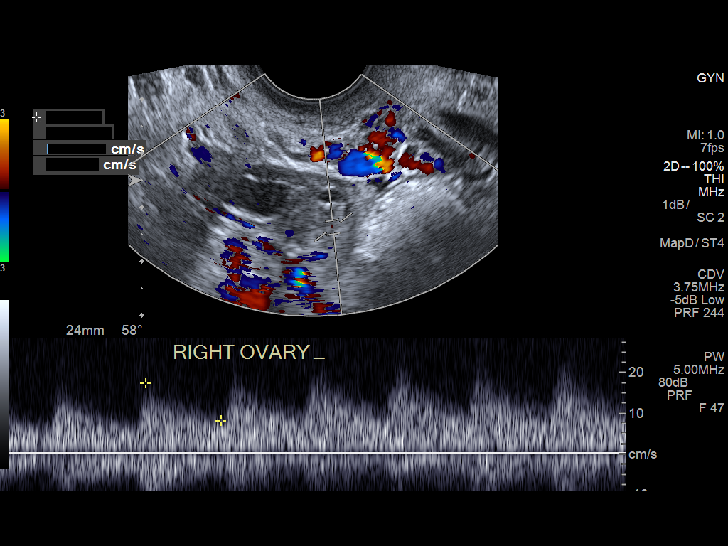
[im 58/58]
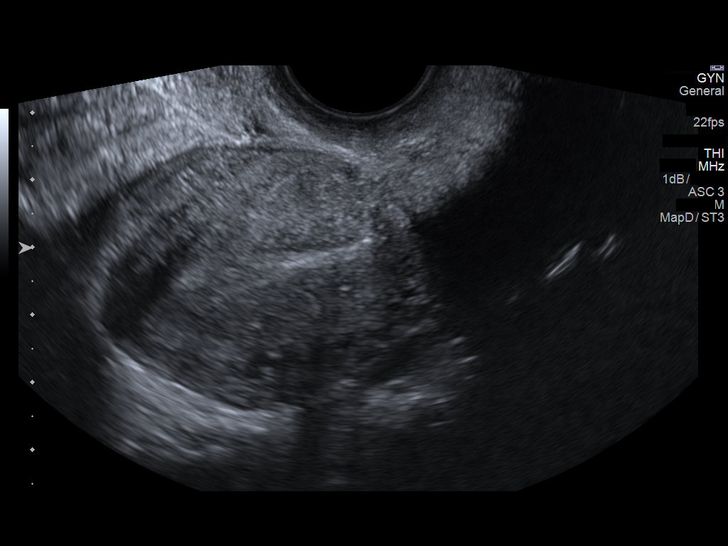

[14 of 25 positions shown; findings below may reference images not displayed]

FINDINGS: Uterus

Measurements: 8.2 x 4.3 x 5.3 cm. No fibroids or other mass
visualized.

Endometrium

Thickness: 0.9 cm.  No focal abnormality visualized.

Right ovary

Measurements: 2.7 x 1.7 x 2.2 cm. Normal appearance/no adnexal mass.

Left ovary

Measurements: 3.8 x 2.2 x 3.1 cm. Normal appearance/no adnexal mass.

Pulsed Doppler evaluation of both ovaries demonstrates normal
low-resistance arterial and venous waveforms.

Other findings

No abnormal free fluid.
IMPRESSION: Normal pelvic ultrasound.  Negative for ovarian torsion.

## 2017-03-31 ENCOUNTER — Emergency Department (HOSPITAL_COMMUNITY)
Admission: EM | Admit: 2017-03-31 | Discharge: 2017-03-31 | Disposition: A | Discharge status: Home / Self Care | Payer: Medicaid Other | Attending: Emergency Medicine | Admitting: Emergency Medicine

## 2017-03-31 ENCOUNTER — Other Ambulatory Visit: Payer: Self-pay

## 2017-03-31 ENCOUNTER — Encounter (HOSPITAL_COMMUNITY): Payer: Self-pay

## 2017-03-31 DIAGNOSIS — R590 Localized enlarged lymph nodes: Secondary | ICD-10-CM | POA: Insufficient documentation

## 2017-03-31 DIAGNOSIS — F17210 Nicotine dependence, cigarettes, uncomplicated: Secondary | ICD-10-CM | POA: Diagnosis not present

## 2017-03-31 DIAGNOSIS — R1310 Dysphagia, unspecified: Secondary | ICD-10-CM | POA: Diagnosis not present

## 2017-03-31 DIAGNOSIS — J029 Acute pharyngitis, unspecified: Secondary | ICD-10-CM | POA: Insufficient documentation

## 2017-03-31 DIAGNOSIS — Z79899 Other long term (current) drug therapy: Secondary | ICD-10-CM | POA: Diagnosis not present

## 2017-03-31 LAB — RAPID STREP SCREEN (MED CTR MEBANE ONLY): STREPTOCOCCUS, GROUP A SCREEN (DIRECT): NEGATIVE

## 2017-03-31 NOTE — ED Provider Notes (Signed)
[facility] EMERGENCY DEPARTMENT Provider Note   CSN: [id number] Arrival date & time: 03/31/17  1359     History   Chief Complaint No chief complaint on file.   HPI Miranda Stephenson is a 31 y.o. female.  Patient presents with complaint of sore throat starting at 11 AM today.  Patient notes a sick contact at her job 2 days ago.  She has not had any associated fevers, ear pain, runny nose, cough.  No nausea or vomiting.  No treatments prior to arrival.  Patient was concerned that she was developing strep throat.  She is swallowing okay.  The onset of this condition was acute. The course is constant. Aggravating factors: none. Alleviating factors: none.        Past Medical History:  Diagnosis Date  . Herpes     There are no active problems to display for this patient.   Past Surgical History:  Procedure Laterality Date  . CESAREAN SECTION    . FOOT SURGERY      OB History    No data available       Home Medications    Prior to Admission medications   Medication Sig Start Date End Date Taking? Authorizing Provider  acetaminophen (TYLENOL) 500 MG tablet Take 1 tablet (500 mg total) by mouth every 6 (six) hours as needed. 05/07/16   Law, Bea Graff, PA-C  amoxicillin (AMOXIL) 500 MG capsule Take 2 capsules (1,000 mg total) by mouth 2 (two) times daily. 10/25/15   Janne Napoleon, NP  benzonatate (TESSALON) 100 MG capsule Take 1 capsule (100 mg total) by mouth every 8 (eight) hours. 05/07/16   Law, Bea Graff, PA-C  cephALEXin (KEFLEX) 500 MG capsule Take 1 capsule (500 mg total) by mouth 2 (two) times daily. 10/01/15   Gloriann Loan, PA-C  cetirizine-pseudoephedrine (ZYRTEC-D) 5-120 MG tablet Take 1 tablet by mouth 2 (two) times daily. 05/07/16   Frederica Kuster, PA-C  HYDROcodone-acetaminophen (NORCO/VICODIN) 5-325 MG tablet Take 1 tablet by mouth every 4 (four) hours as needed. 10/25/15   Janne Napoleon, NP  ibuprofen (ADVIL,MOTRIN) 600 MG tablet Take 1 tablet  (600 mg total) by mouth every 6 (six) hours as needed. 05/07/16   Law, Bea Graff, PA-C  metroNIDAZOLE (FLAGYL) 500 MG tablet Take 1 tablet (500 mg total) by mouth 2 (two) times daily. 10/01/15   Gloriann Loan, PA-C  Multiple Vitamin (MULTIVITAMIN WITH MINERALS) TABS tablet Take 1 tablet by mouth daily.    [provider]  naproxen (NAPROSYN) 500 MG tablet Take 1 tablet (500 mg total) by mouth 2 (two) times daily. 10/01/15   Gloriann Loan, PA-C  naproxen sodium (ANAPROX) 220 MG tablet Take 220-440 mg by mouth 2 (two) times daily as needed (pain).    [provider]    Family History No family history on file.  Social History Social History   Tobacco Use  . Smoking status: Light Tobacco Smoker    Types: Cigarettes  . Smokeless tobacco: Never Used  Substance Use Topics  . Alcohol use: Yes  . Drug use: Not on file     Allergies   Latex   Review of Systems Review of Systems  Constitutional: Negative for chills, fatigue and fever.  HENT: Positive for sore throat and trouble swallowing. Negative for congestion, ear pain, rhinorrhea and sinus pressure.   Eyes: Negative for redness.  Respiratory: Negative for cough and wheezing.   Gastrointestinal: Negative for abdominal pain, diarrhea, nausea and vomiting.  Genitourinary:  Negative for dysuria.  Musculoskeletal: Negative for myalgias and neck stiffness.  Skin: Negative for rash.  Neurological: Negative for headaches.  Hematological: Negative for adenopathy.     Physical Exam Updated Vital Signs BP 126/72 (BP Location: Right Arm)   Pulse (!) 106   Temp 99.1 F (37.3 C) (Oral)   Resp 16   Ht 5' 2"  (1.575 m)   Wt 54.4 kg (120 lb)   SpO2 100%   BMI 21.95 kg/m   Physical Exam  Constitutional: She appears well-developed and well-nourished.  HENT:  Head: Normocephalic and atraumatic.  Right Ear: Tympanic membrane, external ear and ear canal normal.  Left Ear: Tympanic membrane, external ear and ear canal  normal.  Nose: Nose normal. No mucosal edema or rhinorrhea.  Mouth/Throat: Uvula is midline and mucous membranes are normal. Mucous membranes are not dry. No oral lesions. No trismus in the jaw. No uvula swelling. Posterior oropharyngeal erythema present. No posterior oropharyngeal edema or tonsillar abscesses. Tonsils are 3+ on the right. Tonsils are 3+ on the left. Tonsillar exudate.  Eyes: Conjunctivae are normal. Right eye exhibits no discharge. Left eye exhibits no discharge.  Neck: Normal range of motion. Neck supple.  Cardiovascular: Normal rate, regular rhythm and normal heart sounds.  Pulmonary/Chest: Effort normal and breath sounds normal. No respiratory distress. She has no wheezes. She has no rales.  Abdominal: Soft. There is no tenderness.  Lymphadenopathy:    She has cervical adenopathy.  Neurological: She is alert.  Skin: Skin is warm and dry.  Psychiatric: She has a normal mood and affect.  Nursing note and vitals reviewed.    ED Treatments / Results  Labs (all labs ordered are listed, but only abnormal results are displayed) Labs Reviewed  RAPID STREP SCREEN (NOT AT [facility])  CULTURE, GROUP A STREP [facility])    EKG  EKG Interpretation None       Radiology No results found.  Procedures Procedures (including critical care time)  Medications Ordered in ED Medications - No data to display   Initial Impression / Assessment and Plan / ED Course  I have reviewed the triage vital signs and the nursing notes.  Pertinent labs & imaging results that were available during my care of the patient were reviewed by me and considered in my medical decision making (see chart for details).     Patient seen and examined.   Vital signs reviewed and are as follows: BP 126/72 (BP Location: Right Arm)   Pulse (!) 106   Temp 99.1 F (37.3 C) (Oral)   Resp 16   Ht 5' 2"  (1.575 m)   Wt 54.4 kg (120 lb)   SpO2 100%   BMI 21.95 kg/m   Patient counseled on supportive care  for viral URI and s/s to return including worsening symptoms, persistent fever, persistent vomiting, or if they have any other concerns. Urged to see PCP if symptoms persist for more than 3 days. Patient verbalizes understanding and agrees with plan.     Final Clinical Impressions(s) / ED Diagnoses   Final diagnoses:  Pharyngitis, unspecified etiology   Three of four CENTOR criteria met (cervical adenopathy, tonsillar/pharyngeal exudate, no cough).  Strep screen negative.  Antibiotics not indicated. No abscess.    ED Discharge Orders    None       Carlisle Cater, Hershal Coria 03/31/17 1617    Cardama, Grayce Sessions, MD 04/01/17 [phone]

## 2017-03-31 NOTE — Discharge Instructions (Signed)
Please read and follow all provided instructions.  Your diagnoses today include:  1. Pharyngitis, unspecified etiology     Tests performed today include:  Strep test: was negative for strep throat  Strep culture: you will be notified if this comes back positive  Vital signs. See below for your results today.   Medications prescribed:   None  Home care instructions:  Please read the educational materials provided and follow any instructions contained in this packet.  Follow-up instructions: Please follow-up with your primary care provider as needed for further evaluation of your symptoms.  Return instructions:   Please return to the Emergency Department if you experience worsening symptoms.   Return if you have worsening problems swallowing, your neck becomes swollen, you cannot swallow your saliva or your voice becomes muffled.   Return with high persistent fever, persistent vomiting, or if you have trouble breathing.   Please return if you have any other emergent concerns.  Additional Information:  Your vital signs today were: BP 126/72 (BP Location: Right Arm)    Pulse (!) 106    Temp 99.1 F (37.3 C) (Oral)    Resp 16    Ht 5\' 2"  (1.575 m)    Wt 54.4 kg (120 lb)    SpO2 100%    BMI 21.95 kg/m  If your blood pressure (BP) was elevated above 135/85 this visit, please have this repeated by your doctor within one month. --------------

## 2017-03-31 NOTE — ED Triage Notes (Signed)
Patient complains of sore throat x 1 day, NAD

## 2017-03-31 NOTE — ED Notes (Signed)
Declined W/C at D/C and was escorted to lobby by RN. 

## 2017-04-02 LAB — CULTURE, GROUP A STREP (THRC)

## 2017-10-01 ENCOUNTER — Encounter (HOSPITAL_COMMUNITY): Payer: Self-pay | Admitting: Emergency Medicine

## 2017-10-01 ENCOUNTER — Ambulatory Visit (HOSPITAL_COMMUNITY)
Admission: EM | Admit: 2017-10-01 | Discharge: 2017-10-01 | Disposition: A | Discharge status: Home / Self Care | Payer: Self-pay | Attending: Internal Medicine | Admitting: Internal Medicine

## 2017-10-01 DIAGNOSIS — B349 Viral infection, unspecified: Secondary | ICD-10-CM

## 2017-10-01 MED ORDER — IPRATROPIUM BROMIDE 0.06 % NA SOLN: 2.0000 | Freq: Four times a day (QID) | NASAL | 0 refills | Status: DC

## 2017-10-01 MED ORDER — CETIRIZINE HCL 10 MG PO TABS: 10.0000 mg | ORAL_TABLET | Freq: Every day | ORAL | 0 refills | Status: DC

## 2017-10-01 MED ORDER — BENZONATATE 100 MG PO CAPS: 100.0000 mg | ORAL_CAPSULE | Freq: Three times a day (TID) | ORAL | 0 refills | Status: DC

## 2017-10-01 MED ORDER — FLUTICASONE PROPIONATE 50 MCG/ACT NA SUSP: 2.0000 | Freq: Every day | NASAL | 0 refills | Status: DC

## 2017-10-01 NOTE — Discharge Instructions (Addendum)
Tessalon for cough. Start flonase, atrovent nasal spray, zyrtec for nasal congestion/drainage. You can use over the counter nasal saline rinse such as neti pot for nasal congestion. Keep hydrated, your urine should be clear to pale yellow in color. Tylenol/motrin for fever and pain. Monitor for any worsening of symptoms, chest pain, shortness of breath, wheezing, swelling of the throat, follow up for reevaluation.  ° °For sore throat/cough try using a honey-based tea. Use 3 teaspoons of honey with juice squeezed from half lemon. Place shaved pieces of ginger into 1/2-1 cup of water and warm over stove top. Then mix the ingredients and repeat every 4 hours as needed. ° °

## 2017-10-01 NOTE — ED Provider Notes (Signed)
[facility]    CSN: [id number] Arrival date & time: 10/01/17  1007     History   Chief Complaint Chief Complaint  Patient presents with  . URI    HPI Miranda Stephenson is a 32 y.o. female.   32 year old female comes in for 4-day history of URI symptoms.  Has had rhinorrhea, nasal congestion, cough, sore throat.  States she feels short of breath because she cannot breathe through her nose.  Has had subjective fever with chills.  She has not taken anything for her symptoms.  Current everyday smoker, 1 to 2 cigs/day, 20-year.     Past Medical History:  Diagnosis Date  . Herpes     There are no active problems to display for this patient.   Past Surgical History:  Procedure Laterality Date  . CESAREAN SECTION    . FOOT SURGERY      OB History   None      Home Medications    Prior to Admission medications   Medication Sig Start Date End Date Taking? Authorizing Provider  benzonatate (TESSALON) 100 MG capsule Take 1 capsule (100 mg total) by mouth every 8 (eight) hours. 10/01/17   Cathie Hoops, Michi Herrmann V, PA-C  cetirizine (ZYRTEC) 10 MG tablet Take 1 tablet (10 mg total) by mouth daily. 10/01/17   Cathie Hoops, Kirke Breach V, PA-C  fluticasone (FLONASE) 50 MCG/ACT nasal spray Place 2 sprays into both nostrils daily. 10/01/17   Cathie Hoops, Deagen Krass V, PA-C  ipratropium (ATROVENT) 0.06 % nasal spray Place 2 sprays into both nostrils 4 (four) times daily. 10/01/17   Belinda Fisher, PA-C  Multiple Vitamin (MULTIVITAMIN WITH MINERALS) TABS tablet Take 1 tablet by mouth daily.    [provider]    Family History History reviewed. No pertinent family history.  Social History Social History   Tobacco Use  . Smoking status: Light Tobacco Smoker    Types: Cigarettes  . Smokeless tobacco: Never Used  Substance Use Topics  . Alcohol use: Yes  . Drug use: Not on file     Allergies   Latex   Review of Systems Review of Systems  Reason unable to perform ROS: See HPI as above.     Physical  Exam Triage Vital Signs ED Triage Vitals [10/01/17 1034]  Enc Vitals Group     BP 110/64     Pulse Rate 97     Resp 18     Temp 98.6 F (37 C)     Temp Source Oral     SpO2 100 %     Weight      Height      Head Circumference      Peak Flow      Pain Score      Pain Loc      Pain Edu?      Excl. in GC?    No data found.  Updated Vital Signs BP 110/64 (BP Location: Right Arm)   Pulse 97   Temp 98.6 F (37 C) (Oral)   Resp 18   SpO2 100%   Physical Exam  Constitutional: She is oriented to person, place, and time. She appears well-developed and well-nourished. No distress.  HENT:  Head: Normocephalic and atraumatic.  Right Ear: Tympanic membrane, external ear and ear canal normal. Tympanic membrane is not erythematous and not bulging.  Left Ear: Tympanic membrane, external ear and ear canal normal. Tympanic membrane is not erythematous and not bulging.  Nose: Mucosal edema present. Right  sinus exhibits no maxillary sinus tenderness and no frontal sinus tenderness. Left sinus exhibits no maxillary sinus tenderness and no frontal sinus tenderness.  Mouth/Throat: Uvula is midline, oropharynx is clear and moist and mucous membranes are normal.  Eyes: Pupils are equal, round, and reactive to light. Conjunctivae are normal.  Neck: Normal range of motion. Neck supple.  Cardiovascular: Normal rate, regular rhythm and normal heart sounds. Exam reveals no gallop and no friction rub.  No murmur heard. Pulmonary/Chest: Effort normal and breath sounds normal. She has no decreased breath sounds. She has no wheezes. She has no rhonchi. She has no rales.  Lymphadenopathy:    She has no cervical adenopathy.  Neurological: She is alert and oriented to person, place, and time.  Skin: Skin is warm and dry.  Psychiatric: She has a normal mood and affect. Her behavior is normal. Judgment normal.     UC Treatments / Results  Labs (all labs ordered are listed, but only abnormal results are  displayed) Labs Reviewed - No data to display  EKG None  Radiology No results found.  Procedures Procedures (including critical care time)  Medications Ordered in UC Medications - No data to display  Initial Impression / Assessment and Plan / UC Course  I have reviewed the triage vital signs and the nursing notes.  Pertinent labs & imaging results that were available during my care of the patient were reviewed by me and considered in my medical decision making (see chart for details).    Discussed with patient history and exam most consistent with viral URI. Symptomatic treatment as needed. Push fluids. Return precautions given.   Final Clinical Impressions(s) / UC Diagnoses   Final diagnoses:  Viral syndrome    ED Prescriptions    Medication Sig Dispense Auth. Provider   benzonatate (TESSALON) 100 MG capsule Take 1 capsule (100 mg total) by mouth every 8 (eight) hours. 21 capsule Rajat Staver V, PA-C   fluticasone (FLONASE) 50 MCG/ACT nasal spray Place 2 sprays into both nostrils daily. 1 g Shatera Rennert V, PA-C   ipratropium (ATROVENT) 0.06 % nasal spray Place 2 sprays into both nostrils 4 (four) times daily. 15 mL Lindsie Simar V, PA-C   cetirizine (ZYRTEC) 10 MG tablet Take 1 tablet (10 mg total) by mouth daily. 15 tablet Threasa AlphaYu, Vinton Layson V, PA-C        Claudie Brickhouse V, [place] JerseyPA-C 10/01/17 1124

## 2017-10-01 NOTE — ED Triage Notes (Signed)
Pt sts URI sx with congestion 

## 2017-10-14 ENCOUNTER — Emergency Department (HOSPITAL_COMMUNITY)
Admission: EM | Admit: 2017-10-14 | Discharge: 2017-10-14 | Disposition: A | Discharge status: Home / Self Care | Payer: Self-pay | Attending: Emergency Medicine | Admitting: Emergency Medicine

## 2017-10-14 ENCOUNTER — Emergency Department (HOSPITAL_COMMUNITY): Payer: Self-pay

## 2017-10-14 ENCOUNTER — Encounter (HOSPITAL_COMMUNITY): Payer: Self-pay | Admitting: Emergency Medicine

## 2017-10-14 DIAGNOSIS — R0789 Other chest pain: Secondary | ICD-10-CM | POA: Insufficient documentation

## 2017-10-14 DIAGNOSIS — Z79899 Other long term (current) drug therapy: Secondary | ICD-10-CM | POA: Insufficient documentation

## 2017-10-14 DIAGNOSIS — F17210 Nicotine dependence, cigarettes, uncomplicated: Secondary | ICD-10-CM | POA: Insufficient documentation

## 2017-10-14 LAB — CBC
HCT: 42.6 % (ref 36.0–46.0)
HEMOGLOBIN: 13.7 g/dL (ref 12.0–15.0)
MCH: 29.5 pg (ref 26.0–34.0)
MCHC: 32.2 g/dL (ref 30.0–36.0)
MCV: 91.8 fL (ref 78.0–100.0)
Platelets: 341 10*3/uL (ref 150–400)
RBC: 4.64 MIL/uL (ref 3.87–5.11)
RDW: 12.8 % (ref 11.5–15.5)
WBC: 9.8 10*3/uL (ref 4.0–10.5)

## 2017-10-14 LAB — BASIC METABOLIC PANEL
Anion gap: 12 (ref 5–15)
BUN: 6 mg/dL (ref 6–20)
CO2: 21 mmol/L — ABNORMAL LOW (ref 22–32)
Calcium: 8.9 mg/dL (ref 8.9–10.3)
Chloride: 103 mmol/L (ref 98–111)
Creatinine, Ser: 0.86 mg/dL (ref 0.44–1.00)
GFR calc Af Amer: >60 mL/min (ref >60)
GLUCOSE: 80 mg/dL (ref 70–99)
Potassium: 3.6 mmol/L (ref 3.5–5.1)
Sodium: 136 mmol/L (ref 135–145)

## 2017-10-14 LAB — I-STAT BETA HCG BLOOD, ED (MC, WL, AP ONLY)

## 2017-10-14 LAB — I-STAT TROPONIN, ED: Troponin i, poc: 0 ng/mL (ref 0.00–0.08)

## 2017-10-14 NOTE — ED Triage Notes (Signed)
Pt c/o upper mid cp onset, aching, +shob, +nausea, + emesis prior to arrival onset yesterday

## 2017-10-14 NOTE — ED Notes (Signed)
Patient up to desk, states she would like to leave.  Pt c/o exhaustion and states she is too tired to stay.  This RN explained that her symptoms may be related to her chief complaint.  Patient agreed to stay, will prioritize rooming

## 2017-10-14 NOTE — ED Notes (Addendum)
Pt ambulated with pulse ox, stats remained above 93% on RA, HR 96

## 2017-10-14 NOTE — ED Provider Notes (Signed)
[facility] Provider Note   CSN: [id number] Arrival date & time: 10/14/17  1954     History   Chief Complaint Chief Complaint  Patient presents with  . Chest Pain    HPI Miranda Stephenson is a 32 y.o. female.  HPI   Miranda Stephenson is a 32 y.o. female, patient with no pertinent past medical history, presenting to the ED with chest pain beginning yesterday. Accompanied by some intermittent shortness of breath.  She endorses nonproductive cough and nasal congestion for the past week. Patient indicates this pain follows the line between her sternum and her ribs.  It seems to be positional.  It does not worsen when she leans forward.  It is instantaneous, sharp, moderate, nonradiating.   Denies dizziness, syncope, diaphoresis, N/V/D, fever/chills, abdominal pain, lower extremity swelling or pain, or any other complaints.  Past Medical History:  Diagnosis Date  . Herpes     There are no active problems to display for this patient.   Past Surgical History:  Procedure Laterality Date  . CESAREAN SECTION    . FOOT SURGERY       OB History   None      Home Medications    Prior to Admission medications   Medication Sig Start Date End Date Taking? Authorizing Provider  benzonatate (TESSALON) 100 MG capsule Take 1 capsule (100 mg total) by mouth every 8 (eight) hours. 10/01/17   Cathie HoopsYu, Amy V, PA-C  cetirizine (ZYRTEC) 10 MG tablet Take 1 tablet (10 mg total) by mouth daily. 10/01/17   Cathie HoopsYu, Amy V, PA-C  fluticasone (FLONASE) 50 MCG/ACT nasal spray Place 2 sprays into both nostrils daily. 10/01/17   Cathie HoopsYu, Amy V, PA-C  ipratropium (ATROVENT) 0.06 % nasal spray Place 2 sprays into both nostrils 4 (four) times daily. 10/01/17   Belinda FisherYu, Amy V, PA-C  Multiple Vitamin (MULTIVITAMIN WITH MINERALS) TABS tablet Take 1 tablet by mouth daily.    [provider]    Family History No family history on file.  Social History Social History   Tobacco Use    . Smoking status: Light Tobacco Smoker    Types: Cigarettes  . Smokeless tobacco: Never Used  Substance Use Topics  . Alcohol use: Yes    Comment: socially  . Drug use: Yes    Types: Marijuana     Allergies   Latex   Review of Systems Review of Systems  Constitutional: Negative for chills, diaphoresis and fever.  Respiratory: Positive for cough and shortness of breath.   Cardiovascular: Positive for chest pain. Negative for leg swelling.  Gastrointestinal: Negative for abdominal pain, diarrhea, nausea and vomiting.  Neurological: Negative for dizziness, syncope and light-headedness.  All other systems reviewed and are negative.    Physical Exam Updated Vital Signs BP 121/73 (BP Location: Right Arm)   Pulse 100   Resp 18   Ht 5\' 2"  (1.575 m)   Wt 54.4 kg (120 lb)   LMP 10/10/2017   SpO2 100%   BMI 21.95 kg/m   Physical Exam  Constitutional: She appears well-developed and well-nourished. No distress.  HENT:  Head: Normocephalic and atraumatic.  Eyes: Conjunctivae are normal.  Neck: Neck supple.  Cardiovascular: Normal rate, regular rhythm, normal heart sounds and intact distal pulses.  Pulmonary/Chest: Effort normal and breath sounds normal. No respiratory distress. She exhibits tenderness.  Shows no increased work of breathing.  Speaks in full sentences without difficulty.    Abdominal: Soft. There is no  tenderness. There is no guarding.  Musculoskeletal: She exhibits no edema.  Lymphadenopathy:    She has no cervical adenopathy.  Neurological: She is alert.  Skin: Skin is warm and dry. She is not diaphoretic.  Psychiatric: She has a normal mood and affect. Her behavior is normal.  Nursing note and vitals reviewed.    ED Treatments / Results  Labs (all labs ordered are listed, but only abnormal results are displayed) Labs Reviewed  BASIC METABOLIC PANEL - Abnormal; Notable for the following components:      Result Value   CO2 21 (*)    All other  components within normal limits  CBC  I-STAT TROPONIN, ED  I-STAT BETA HCG BLOOD, ED ([facility], [facility], [facility] ONLY)    EKG EKG Interpretation  Date/Time:  Sunday October 14 2017 20:03:24 EDT Ventricular Rate:  104 PR Interval:  112 QRS Duration: 82 QT Interval:  342 QTC Calculation: 449 R Axis:   81 Text Interpretation:  Sinus tachycardia Biatrial enlargement Abnormal ECG Confirmed by Benjiman Core [id number]) on 10/15/2017 12:43:53 AM   Radiology Dg Chest 2 View  Result Date: 10/14/2017 CLINICAL DATA:  Upper chest pain EXAM: CHEST - 2 VIEW COMPARISON:  None. FINDINGS: The heart size and mediastinal contours are within normal limits. Both lungs are clear. The visualized skeletal structures are unremarkable. IMPRESSION: No active cardiopulmonary disease. Electronically Signed   By: Alcide Clever M.D.   On: 10/14/2017 20:36    Procedures Procedures (including critical care time)  Medications Ordered in ED Medications - No data to display   Initial Impression / Assessment and Plan / ED Course  I have reviewed the triage vital signs and the nursing notes.  Pertinent labs & imaging results that were available during my care of the patient were reviewed by me and considered in my medical decision making (see chart for details).     Patient presents with intermittent chest pain and occasional shortness of breath. Low suspicion for ACS. HEART score is 1, indicating low risk for a cardiac event. Wells criteria score is 1.5, indicating low risk for PE. PERC was unable to be used due to patient's initial mild tachycardia on EKG.  Patient presentation is more than likely due to costochondritis, however, d-dimer recommended due to patient's initial mild tachycardia, some shortness of breath, and mild drop in SPO2 with ambulation.  Patient had no chest pain with ambulation.  Patient declined d-dimer but despite explanation, and states she will return if she changes her mind or if she worsens.        Final Clinical Impressions(s) / ED Diagnoses   Final diagnoses:  Atypical chest pain    ED Discharge Orders    None       Miranda Stephenson 10/15/17 0049    Benjiman Core, MD 10/15/17 1557

## 2017-10-14 NOTE — Discharge Instructions (Addendum)
Antiinflammatory medications: Take 600 mg of ibuprofen every 6 hours or 440 mg (over the counter dose) to 500 mg (prescription dose) of naproxen every 12 hours for the next 3 days. After this time, these medications may be used as needed for pain. Take these medications with food to avoid upset stomach. Choose only one of these medications, do not take them together. Tylenol: Should you continue to have additional pain while taking the ibuprofen or naproxen, you may add in tylenol as needed. Your daily total maximum amount of tylenol from all sources should be limited to 4000mg /day for persons without liver problems, or 2000mg /day for those with liver problems.  Your testing completed You have chosen to leave without complete testing. You are welcome and encouraged to return anytime and your work up will continue according to the discretion of the provider you see at that time.

## 2017-12-21 ENCOUNTER — Encounter (HOSPITAL_COMMUNITY): Payer: Self-pay | Admitting: Emergency Medicine

## 2017-12-21 ENCOUNTER — Emergency Department (HOSPITAL_COMMUNITY)
Admission: EM | Admit: 2017-12-21 | Discharge: 2017-12-21 | Disposition: A | Discharge status: Home / Self Care | Payer: Medicaid Other | Attending: Emergency Medicine | Admitting: Emergency Medicine

## 2017-12-21 DIAGNOSIS — Y929 Unspecified place or not applicable: Secondary | ICD-10-CM | POA: Insufficient documentation

## 2017-12-21 DIAGNOSIS — Y999 Unspecified external cause status: Secondary | ICD-10-CM | POA: Insufficient documentation

## 2017-12-21 DIAGNOSIS — Y9389 Activity, other specified: Secondary | ICD-10-CM | POA: Insufficient documentation

## 2017-12-21 DIAGNOSIS — F17210 Nicotine dependence, cigarettes, uncomplicated: Secondary | ICD-10-CM | POA: Insufficient documentation

## 2017-12-21 DIAGNOSIS — S01112A Laceration without foreign body of left eyelid and periocular area, initial encounter: Secondary | ICD-10-CM
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
## 2018-11-27 ENCOUNTER — Other Ambulatory Visit: Payer: Self-pay

## 2018-11-27 ENCOUNTER — Ambulatory Visit (HOSPITAL_COMMUNITY)
Admission: EM | Admit: 2018-11-27 | Discharge: 2018-11-27 | Disposition: A | Discharge status: Home / Self Care | Payer: Medicaid Other | Attending: Urgent Care | Admitting: Urgent Care

## 2018-11-27 ENCOUNTER — Encounter (HOSPITAL_COMMUNITY): Payer: Self-pay | Admitting: Urgent Care

## 2018-11-27 DIAGNOSIS — S46912A Strain of unspecified muscle, fascia and tendon at shoulder and upper arm level, left arm, initial encounter: Secondary | ICD-10-CM

## 2018-11-27 DIAGNOSIS — M25512 Pain in left shoulder: Secondary | ICD-10-CM

## 2018-11-27 DIAGNOSIS — M542 Cervicalgia: Secondary | ICD-10-CM

## 2018-11-27 DIAGNOSIS — M5412 Radiculopathy, cervical region: Secondary | ICD-10-CM

## 2018-11-27 MED ORDER — CYCLOBENZAPRINE HCL 5 MG PO TABS: 5.0000 mg | ORAL_TABLET | Freq: Three times a day (TID) | ORAL | 0 refills | Status: DC | PRN

## 2018-11-27 MED ORDER — PREDNISONE 20 MG PO TABS: ORAL_TABLET | ORAL | 0 refills | Status: DC

## 2018-11-27 NOTE — ED Provider Notes (Signed)
MRN: [id number] DOB: 12-Sep-1985  Subjective:   Miranda Stephenson is a 33 y.o. female presenting for 1 day history of acute onset constant severe worsening neck pain, stiffness radiating into her shoulder and upper left arm.  Today, she reports that she had burning and tingling worse than last night of her forearm and fingertips.  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  Patient tried a heating pad with minimal relief.  Denies any kind of trauma, falls, history of neck issues or surgeries.   Denies taking any chronic medications.     Allergies  Allergen Reactions  . Latex Anaphylaxis    Past Medical History:  Diagnosis Date  . Herpes      Past Surgical History:  Procedure Laterality Date  . CESAREAN SECTION    . FOOT SURGERY      Review of Systems  Constitutional: Negative for fever and malaise/fatigue.  HENT: Negative for congestion, ear pain, sinus pain and sore throat.   Eyes: Negative for blurred vision, double vision, discharge and redness.  Respiratory: Negative for cough, hemoptysis, shortness of breath and wheezing.   Cardiovascular: Negative for chest pain.  Gastrointestinal: Negative for abdominal pain, diarrhea, nausea and vomiting.  Genitourinary: Negative for dysuria, flank pain and hematuria.  Musculoskeletal: Positive for myalgias and neck pain.  Skin: Negative for rash.  Neurological: Negative for dizziness, weakness and headaches.  Psychiatric/Behavioral: Negative for depression and substance abuse.    Objective:   Vitals: BP 107/77 (BP Location: Right Arm)   Pulse 88   Temp 98.8 F (37.1 C)   Resp 19   Wt 120 lb (54.4 kg)   LMP 11/01/2018   SpO2 100%   BMI 21.95 kg/m   Physical Exam Constitutional:      General: She is not in acute distress.    Appearance: Normal appearance. She is well-developed. She is not ill-appearing.  HENT:     Head:  Normocephalic and atraumatic.     Nose: Nose normal.     Mouth/Throat:     Mouth: Mucous membranes are moist.     Pharynx: Oropharynx is clear.  Eyes:     General: No scleral icterus.    Extraocular Movements: Extraocular movements intact.     Pupils: Pupils are equal, round, and reactive to light.  Cardiovascular:     Rate and Rhythm: Normal rate.  Pulmonary:     Effort: Pulmonary effort is normal.  Musculoskeletal:     Left shoulder: She exhibits decreased range of motion. She exhibits no tenderness, no bony tenderness, no swelling, no effusion, no crepitus, no deformity and normal strength.     Cervical back: She exhibits decreased range of motion (in all directions worse with flexion and extension), tenderness (Over cervical paraspinal muscles extending into the left trapezius over significant spasm) and spasm. She exhibits no bony tenderness, no swelling and no edema.     Comments: Negative Spurling and Lhermitte's sign.  Skin:    General: Skin is warm and dry.  Neurological:     General: No focal deficit present.     Mental Status: She is alert and oriented to person, place, and time.  Psychiatric:        Mood and Affect: Mood normal.        Behavior: Behavior normal.      Assessment and Plan :   1. Cervical radiculopathy  2. Neck pain   3. Acute pain of left shoulder   4. Strain of left shoulder, initial encounter    We will use high-dose steroid course given cervical radiculopathy.  Patient is to use muscle relaxant as well.  Recommend that she hydrate much better and patient was eventually agreeable to taking time off work. Counseled patient on potential for adverse effects with medications prescribed/recommended today, ER and return-to-clinic precautions discussed, patient verbalized understanding.    Wallis BambergMani, Milena Liggett, PA-C 11/27/18 1137

## 2018-11-27 NOTE — ED Triage Notes (Signed)
Pt states she has shoulder pain and neck pain. 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 Pt states this started after work. Pt states the pain is like a burning sensation.

## 2019-05-21 ENCOUNTER — Other Ambulatory Visit: Payer: Self-pay

## 2019-05-21 ENCOUNTER — Ambulatory Visit (HOSPITAL_COMMUNITY)
Admission: EM | Admit: 2019-05-21 | Discharge: 2019-05-21 | Disposition: A | Discharge status: Home / Self Care | Payer: Medicaid Other | Attending: Family Medicine | Admitting: Family Medicine

## 2019-05-21 ENCOUNTER — Encounter (HOSPITAL_COMMUNITY): Payer: Self-pay

## 2019-05-21 DIAGNOSIS — R52 Pain, unspecified: Secondary | ICD-10-CM | POA: Diagnosis not present

## 2019-05-21 DIAGNOSIS — R439 Unspecified disturbances of smell and taste: Secondary | ICD-10-CM

## 2019-05-21 DIAGNOSIS — Z20822 Contact with and (suspected) exposure to covid-19: Secondary | ICD-10-CM | POA: Diagnosis not present

## 2019-05-21 DIAGNOSIS — R197 Diarrhea, unspecified: Secondary | ICD-10-CM | POA: Diagnosis not present

## 2019-05-21 DIAGNOSIS — F17210 Nicotine dependence, cigarettes, uncomplicated: Secondary | ICD-10-CM | POA: Insufficient documentation

## 2019-05-21 DIAGNOSIS — R0981 Nasal congestion: Secondary | ICD-10-CM

## 2019-05-21 MED ORDER — PSEUDOEPH-BROMPHEN-DM 30-2-10 MG/5ML PO SYRP: 5.0000 mL | ORAL_SOLUTION | Freq: Four times a day (QID) | ORAL | 0 refills | Status: DC | PRN

## 2019-05-21 NOTE — ED Triage Notes (Signed)
Pt presents to UC with nasal congestion, body aches and diarrhea x 4 days. Pt reports she lost the smell sensation this morning.

## 2019-05-21 NOTE — ED Provider Notes (Signed)
[facility] CARE CENTER   [id number] 05/21/19 Arrival Time: 1024  ASSESSMENT & PLAN:  1. Nasal congestion   2. Decreased taste and smell   3. Diarrhea, unspecified type   4. Generalized body aches      COVID-19 testing sent. See letter/work note on file for self-isolation guidelines.  Meds ordered this encounter  Medications  . brompheniramine-pseudoephedrine-DM 30-2-10 MG/5ML syrup    Sig: Take 5 mLs by mouth 4 (four) times daily as needed.    Dispense:  120 mL    Refill:  0     Discharge Instructions     You have been tested for COVID-19 today. If your test returns positive, you will receive a phone call from [facility] regarding your results. Negative test results are not called. Both positive and negative results area always visible on MyChart. If you do not have a MyChart account, sign up instructions are provided in your discharge papers. Please do not hesitate to contact us should you have questions or concerns.  Please do your best to ensure adequate fluid intake in order to avoid dehydration. If you find that you are unable to tolerate drinking fluids regularly please proceed to the Emergency Department for evaluation.      Follow-up Information    [facility].   Specialty: Urgent Care Why: As needed. Contact information: [address] [place] [address] [phone]          Reviewed expectations re: course of current medical issues. Questions answered. Outlined signs and symptoms indicating need for more acute intervention. Patient verbalized understanding. After Visit Summary given.   SUBJECTIVE: History from: patient. Miranda Stephenson is a 34 y.o. female who requests COVID-19 testing. Known COVID-19 contact: she is unsure; works in a rehabilitation facility.. Recent travel: none. Denies: fever and headache. Reports nasal congestion, body aches, non-bloody diarrhea for 3-4 days. Normal PO intake  without n/v. Noticed decreased taste/smell today.    OBJECTIVE:  Vitals:   05/21/19 1101  BP: 102/63  Pulse: 86  Resp: 16  Temp: 98.1 F (36.7 C)  TempSrc: Oral  SpO2: 100%    General appearance: alert; no distress Eyes: PERRLA; EOMI; conjunctiva normal HENT: [place]; AT; nasal mucosa normal; oral mucosa normal Neck: supple  Lungs: speaks full sentences without difficulty; unlabored Extremities: no edema Skin: warm and dry Neurologic: normal gait Psychological: alert and cooperative; normal mood and affect  Labs:  Labs Reviewed  NOVEL CORONAVIRUS, NAA (HOSP ORDER, SEND-OUT TO REF LAB; TAT 18-24 HRS)     Allergies  Allergen Reactions  . Latex Anaphylaxis    Past Medical History:  Diagnosis Date  . Herpes    Social History   Socioeconomic History  . Marital status: Single    Spouse name: Not on file  . Number of children: Not on file  . Years of education: Not on file  . Highest education level: Not on file  Occupational History  . Not on file  Tobacco Use  . Smoking status: Light Tobacco Smoker    Types: Cigarettes  . Smokeless tobacco: Never Used  Substance and Sexual Activity  . Alcohol use: Yes    Comment: socially  . Drug use: Yes    Types: Marijuana  . Sexual activity: Not on file  Other Topics Concern  . Not on file  Social History Narrative  . Not on file   Social Determinants of Health   Financial Resource Strain:   . Difficulty of  Paying Living Expenses: Not on file  Food Insecurity:   . Worried About Charity fundraiser in the Last Year: Not on file  . Ran Out of Food in the Last Year: Not on file  Transportation Needs:   . Lack of Transportation (Medical): Not on file  . Lack of Transportation (Non-Medical): Not on file  Physical Activity:   . Days of Exercise per Week: Not on file  . Minutes of Exercise per Session: Not on file  Stress:   . Feeling of Stress : Not on file  Social Connections:   . Frequency of Communication with  Friends and Family: Not on file  . Frequency of Social Gatherings with Friends and Family: Not on file  . Attends Religious Services: Not on file  . Active Member of Clubs or Organizations: Not on file  . Attends Archivist Meetings: Not on file  . Marital Status: Not on file  Intimate Partner Violence:   . Fear of Current or Ex-Partner: Not on file  . Emotionally Abused: Not on file  . Physically Abused: Not on file  . Sexually Abused: Not on file   History reviewed. No pertinent family history. Past Surgical History:  Procedure Laterality Date  . CESAREAN SECTION    . FOOT SURGERY       Vanessa Kick, MD 05/21/19 1114

## 2019-05-21 NOTE — Discharge Instructions (Addendum)
You have been tested for COVID-19 today. If your test returns positive, you will receive a phone call from [place] regarding your results. Negative test results are not called. Both positive and negative results area always visible on MyChart. If you do not have a MyChart account, sign up instructions are provided in your discharge papers. Please do not hesitate to contact us should you have questions or concerns.  Please do your best to ensure adequate fluid intake in order to avoid dehydration. If you find that you are unable to tolerate drinking fluids regularly please proceed to the Emergency Department for evaluation.   

## 2019-05-23 LAB — NOVEL CORONAVIRUS, NAA (HOSP ORDER, SEND-OUT TO REF LAB; TAT 18-24 HRS): SARS-CoV-2, NAA: NOT DETECTED

## 2019-10-06 ENCOUNTER — Other Ambulatory Visit: Payer: Self-pay

## 2019-10-06 ENCOUNTER — Encounter (HOSPITAL_COMMUNITY): Payer: Self-pay

## 2019-10-06 ENCOUNTER — Ambulatory Visit (HOSPITAL_COMMUNITY)
Admission: EM | Admit: 2019-10-06 | Discharge: 2019-10-06 | Disposition: A | Discharge status: Home / Self Care | Payer: Medicaid Other | Attending: Family Medicine | Admitting: Family Medicine

## 2019-10-06 DIAGNOSIS — R110 Nausea: Secondary | ICD-10-CM | POA: Insufficient documentation

## 2019-10-06 DIAGNOSIS — R0981 Nasal congestion: Secondary | ICD-10-CM | POA: Diagnosis not present

## 2019-10-06 DIAGNOSIS — R519 Headache, unspecified: Secondary | ICD-10-CM | POA: Diagnosis not present

## 2019-10-06 DIAGNOSIS — Z7952 Long term (current) use of systemic steroids: Secondary | ICD-10-CM | POA: Diagnosis not present

## 2019-10-06 DIAGNOSIS — R6883 Chills (without fever): Secondary | ICD-10-CM | POA: Diagnosis not present

## 2019-10-06 DIAGNOSIS — Z1152 Encounter for screening for COVID-19: Secondary | ICD-10-CM

## 2019-10-06 DIAGNOSIS — F17210 Nicotine dependence, cigarettes, uncomplicated: Secondary | ICD-10-CM | POA: Insufficient documentation

## 2019-10-06 DIAGNOSIS — Z79899 Other long term (current) drug therapy: Secondary | ICD-10-CM | POA: Insufficient documentation

## 2019-10-06 DIAGNOSIS — Z20822 Contact with and (suspected) exposure to covid-19: Secondary | ICD-10-CM | POA: Insufficient documentation

## 2019-10-06 DIAGNOSIS — B349 Viral infection, unspecified: Secondary | ICD-10-CM | POA: Diagnosis present

## 2019-10-06 DIAGNOSIS — R52 Pain, unspecified: Secondary | ICD-10-CM

## 2019-10-06 MED ORDER — ONDANSETRON HCL 4 MG PO TABS: 4.0000 mg | ORAL_TABLET | Freq: Four times a day (QID) | ORAL | 0 refills | Status: DC

## 2019-10-06 MED ORDER — PSEUDOEPH-BROMPHEN-DM 30-2-10 MG/5ML PO SYRP: 5.0000 mL | ORAL_SOLUTION | Freq: Four times a day (QID) | ORAL | 0 refills | Status: DC | PRN

## 2019-10-06 NOTE — Discharge Instructions (Addendum)
Your COVID test is pending.  You should self quarantine until the test result is back.    Take Tylenol as needed for fever or discomfort.  Rest and keep yourself hydrated.    Go to the emergency department if you develop shortness of breath, severe diarrhea, high fever not relieved by Tylenol or ibuprofen, or other concerning symptoms.    

## 2019-10-06 NOTE — ED Triage Notes (Signed)
Pt presents to UC with headache, chills, body aches and nasal congestion x 1 day.

## 2019-10-06 NOTE — ED Provider Notes (Signed)
[place]   [id number] 10/06/19 Arrival Time: 5102   CC: COVID symptoms  SUBJECTIVE: History from: patient.  Miranda Stephenson is a 34 y.o. female who presents with abrupt onset of nasal congestion, PND, and persistent dry cough for 2 days. Denies sick exposure to COVID, flu or strep. Reports that she did go to a crowded party about 3 days ago. Denies recent travel. Has had both Covid vaccines. Has not taken OTC medications for this. There are no aggravating symptoms. Denies previous symptoms in the past.   Denies fever, sinus pain, rhinorrhea, sore throat, SOB, wheezing, chest pain, nausea, changes in bowel or bladder habits.    ROS: As per HPI.  All other pertinent ROS negative.     Past Medical History:  Diagnosis Date  . Herpes    Past Surgical History:  Procedure Laterality Date  . CESAREAN SECTION    . FOOT SURGERY     Allergies  Allergen Reactions  . Latex Anaphylaxis   No current facility-administered medications on file prior to encounter.   Current Outpatient Medications on File Prior to Encounter  Medication Sig Dispense Refill  . Multiple Vitamin (MULTIVITAMIN) tablet Take 1 tablet by mouth daily.    . cyclobenzaprine (FLEXERIL) 5 MG tablet Take 1 tablet (5 mg total) by mouth 3 (three) times daily as needed for muscle spasms. 30 tablet 0  . predniSONE (DELTASONE) 20 MG tablet Day 1-3: take 3 tablets daily. Day 4-6: take 2 tablets daily. Day 7-9: take 1 tablet daily. Take medication with breakfast. 18 tablet 0  . [DISCONTINUED] cetirizine (ZYRTEC) 10 MG tablet Take 1 tablet (10 mg total) by mouth daily. (Patient not taking: Reported on 12/21/2017) 15 tablet 0  . [DISCONTINUED] fluticasone (FLONASE) 50 MCG/ACT nasal spray Place 2 sprays into both nostrils daily. (Patient not taking: Reported on 12/21/2017) 1 g 0  . [DISCONTINUED] ipratropium (ATROVENT) 0.06 % nasal spray Place 2 sprays into both nostrils 4 (four) times daily. (Patient not taking: Reported on  12/21/2017) 15 mL 0   Social History   Socioeconomic History  . Marital status: Single    Spouse name: Not on file  . Number of children: Not on file  . Years of education: Not on file  . Highest education level: Not on file  Occupational History  . Not on file  Tobacco Use  . Smoking status: Light Tobacco Smoker    Types: Cigarettes  . Smokeless tobacco: Never Used  Substance and Sexual Activity  . Alcohol use: Yes    Comment: socially  . Drug use: Yes    Types: Marijuana  . Sexual activity: Not on file  Other Topics Concern  . Not on file  Social History Narrative  . Not on file   Social Determinants of Health   Financial Resource Strain:   . Difficulty of Paying Living Expenses:   Food Insecurity:   . Worried About Charity fundraiser in the Last Year:   . Arboriculturist in the Last Year:   Transportation Needs:   . Film/video editor (Medical):   Marland Kitchen Lack of Transportation (Non-Medical):   Physical Activity:   . Days of Exercise per Week:   . Minutes of Exercise per Session:   Stress:   . Feeling of Stress :   Social Connections:   . Frequency of Communication with Friends and Family:   . Frequency of Social Gatherings with Friends and Family:   . Attends Religious Services:   .  Active Member of Clubs or Organizations:   . Attends Banker Meetings:   Marland Kitchen Marital Status:   Intimate Partner Violence:   . Fear of Current or Ex-Partner:   . Emotionally Abused:   Marland Kitchen Physically Abused:   . Sexually Abused:    History reviewed. No pertinent family history.  OBJECTIVE:  Vitals:   10/06/19 1815  BP: (!) 118/53  Pulse: 73  Resp: 18  Temp: 98.3 F (36.8 C)  TempSrc: Oral  SpO2: 100%     General appearance: alert; appears fatigued, but nontoxic; speaking in full sentences and tolerating own secretions HEENT: NCAT; Ears: EACs clear, TMs pearly gray; Eyes: PERRL.  EOM grossly intact. Sinuses: nontender; Nose: nares patent with rhinorrhea,  Throat: oropharynx clear, tonsils non erythematous or enlarged, uvula midline  Neck: supple without LAD Lungs: unlabored respirations, symmetrical air entry; cough: mild; no respiratory distress; CTAB Heart: regular rate and rhythm.  Radial pulses 2+ symmetrical bilaterally Skin: warm and dry Psychological: alert and cooperative; normal mood and affect  LABS:  No results found for this or any previous visit (from the past 24 hour(s)).   ASSESSMENT & PLAN:  1. Viral illness   2. Chills   3. Nonintractable headache, unspecified chronicity pattern, unspecified headache type   4. Nasal congestion   5. Body aches   6. Encounter for screening for COVID-19   7. Nausea without vomiting     Meds ordered this encounter  Medications  . brompheniramine-pseudoephedrine-DM 30-2-10 MG/5ML syrup    Sig: Take 5 mLs by mouth 4 (four) times daily as needed.    Dispense:  120 mL    Refill:  0    Order Specific Question:   Supervising Provider    Answer:   Merrilee Jansky [id number]  . ondansetron (ZOFRAN) 4 MG tablet    Sig: Take 1 tablet (4 mg total) by mouth every 6 (six) hours.    Dispense:  12 tablet    Refill:  0    Order Specific Question:   Supervising Provider    Answer:   Merrilee Jansky [id number]    Viral Illness Headache Chills Nasal Congestion Body Aches Nausea Covid Screen  Prescribed Bromfed and Zofran COVID testing ordered.  It will take between 1-2 days for test results.  Someone will contact you regarding abnormal results.    Patient should remain in quarantine until they have received Covid results.  If negative you may resume normal activities (go back to work/school) while practicing hand hygiene, social distance, and mask wearing.  If positive, patient should remain in quarantine for 10 days from symptom onset AND greater than 72 hours after symptoms resolution (absence of fever without the use of fever-reducing medication and improvement in respiratory symptoms),  whichever is longer Get plenty of rest and push fluids Use OTC zyrtec for nasal congestion, runny nose, and/or sore throat Use OTC flonase for nasal congestion and runny nose Use medications daily for symptom relief Use OTC medications like ibuprofen or tylenol as needed fever or pain Call or go to the ED if you have any new or worsening symptoms such as fever, worsening cough, shortness of breath, chest tightness, chest pain, turning blue, changes in mental status.  Reviewed expectations re: course of current medical issues. Questions answered. Outlined signs and symptoms indicating need for more acute intervention. Patient verbalized understanding. After Visit Summary given.         Moshe Cipro, NP 10/06/19 1853

## 2019-10-07 LAB — SARS CORONAVIRUS 2 (TAT 6-24 HRS): SARS Coronavirus 2: NEGATIVE

## 2019-12-12 ENCOUNTER — Other Ambulatory Visit: Payer: Self-pay

## 2019-12-12 ENCOUNTER — Ambulatory Visit (HOSPITAL_COMMUNITY)
Admission: EM | Admit: 2019-12-12 | Discharge: 2019-12-12 | Disposition: A | Discharge status: Home / Self Care | Payer: Medicaid Other | Attending: Physician Assistant | Admitting: Physician Assistant

## 2019-12-12 ENCOUNTER — Encounter (HOSPITAL_COMMUNITY): Payer: Self-pay

## 2019-12-12 DIAGNOSIS — Z3202 Encounter for pregnancy test, result negative: Secondary | ICD-10-CM

## 2019-12-12 DIAGNOSIS — B349 Viral infection, unspecified: Secondary | ICD-10-CM | POA: Insufficient documentation

## 2019-12-12 DIAGNOSIS — J029 Acute pharyngitis, unspecified: Secondary | ICD-10-CM | POA: Insufficient documentation

## 2019-12-12 DIAGNOSIS — R1110 Vomiting, unspecified: Secondary | ICD-10-CM | POA: Insufficient documentation

## 2019-12-12 DIAGNOSIS — R52 Pain, unspecified: Secondary | ICD-10-CM

## 2019-12-12 DIAGNOSIS — Z20822 Contact with and (suspected) exposure to covid-19: Secondary | ICD-10-CM | POA: Insufficient documentation

## 2019-12-12 DIAGNOSIS — Z79899 Other long term (current) drug therapy: Secondary | ICD-10-CM | POA: Insufficient documentation

## 2019-12-12 DIAGNOSIS — R8290 Unspecified abnormal findings in urine: Secondary | ICD-10-CM | POA: Insufficient documentation

## 2019-12-12 DIAGNOSIS — F17210 Nicotine dependence, cigarettes, uncomplicated: Secondary | ICD-10-CM | POA: Insufficient documentation

## 2019-12-12 DIAGNOSIS — M545 Low back pain: Secondary | ICD-10-CM | POA: Insufficient documentation

## 2019-12-12 DIAGNOSIS — Z7952 Long term (current) use of systemic steroids: Secondary | ICD-10-CM | POA: Insufficient documentation

## 2019-12-12 LAB — POCT URINALYSIS DIPSTICK, ED / UC
Glucose, UA: 100 mg/dL — AB
Ketones, ur: 15 mg/dL — AB
Leukocytes,Ua: NEGATIVE
Nitrite: NEGATIVE
Protein, ur: 30 mg/dL — AB
Specific Gravity, Urine: >=1.03 (ref 1.005–1.030)
Urobilinogen, UA: 0.2 mg/dL (ref 0.0–1.0)
pH: 5.5 (ref 5.0–8.0)

## 2019-12-12 LAB — POCT RAPID STREP A, ED / UC: Streptococcus, Group A Screen (Direct): NEGATIVE

## 2019-12-12 LAB — POC URINE PREG, ED: Preg Test, Ur: NEGATIVE

## 2019-12-12 MED ORDER — CEPACOL SORE THROAT 5.4 MG MT LOZG: 1.0000 | LOZENGE | OROMUCOSAL | 0 refills | Status: DC | PRN

## 2019-12-12 MED ORDER — FLUTICASONE PROPIONATE 50 MCG/ACT NA SUSP: 1.0000 | Freq: Every day | NASAL | 2 refills | Status: DC

## 2019-12-12 MED ORDER — SALINE SPRAY 0.65 % NA SOLN: 1.0000 | NASAL | 0 refills | Status: DC | PRN

## 2019-12-12 MED ORDER — ACETAMINOPHEN 325 MG PO TABS: 650.0000 mg | ORAL_TABLET | Freq: Four times a day (QID) | ORAL | 0 refills | Status: AC | PRN

## 2019-12-12 NOTE — Discharge Instructions (Addendum)
Take medications as prescribed Drink plenty of water  Establish with a  primary care in, you should have a repeated urine test in 2 weeks  Return if not improving, if severe symptoms go to the Emergency department  If your Covid-19 test is positive, you will receive a phone call from [facility] regarding your results. Negative test results are not called. Both positive and negative results area always visible on MyChart. If you do not have a MyChart account, sign up instructions are in your discharge papers.   Persons who are directed to care for themselves at home may discontinue isolation under the following conditions:   At least 10 days have passed since symptom onset and  At least 24 hours have passed without running a fever (this means without the use of fever-reducing medications) and  Other symptoms have improved.  Persons infected with COVID-19 who never develop symptoms may discontinue isolation and other precautions 10 days after the date of their first positive COVID-19 test.

## 2019-12-12 NOTE — ED Provider Notes (Signed)
[facility]    CSN: [id number] Arrival date & time: 12/12/19  0272      History   Chief Complaint Chief Complaint  Patient presents with  . Sore Throat  . Generalized Body Aches  . Vomiting    HPI Miranda Stephenson is a 34 y.o. female.   Patient presents for sore throat, body aches and episodes of vomiting yesterday.  She reports 2 episodes of vomiting yesterday, however has been able to tolerate fluids and has good appetite today.  She reports her throat started last night.  Denies cough.  Does endorse runny nose and congestion that started a few days prior to sore throat.  Some low back pain associated with her body aches however this also radiated into her left flank last night.  Denies pain today..  Denies fever and chills at home.  No painful urination, frequency or urgency.  No blood in her urine.  Moving her bowels per usual without diarrhea or constipation.  Denies vaginal discharge irritation.     Past Medical History:  Diagnosis Date  . Herpes     There are no problems to display for this patient.   Past Surgical History:  Procedure Laterality Date  . CESAREAN SECTION    . FOOT SURGERY      OB History   No obstetric history on file.      Home Medications    Prior to Admission medications   Medication Sig Start Date End Date Taking? Authorizing Provider  acetaminophen (TYLENOL) 325 MG tablet Take 2 tablets (650 mg total) by mouth every 6 (six) hours as needed. 12/12/19   Montserrath Madding, Veryl Speak, PA-C  brompheniramine-pseudoephedrine-DM 30-2-10 MG/5ML syrup Take 5 mLs by mouth 4 (four) times daily as needed. 10/06/19   Moshe Cipro, NP  cyclobenzaprine (FLEXERIL) 5 MG tablet Take 1 tablet (5 mg total) by mouth 3 (three) times daily as needed for muscle spasms. 11/27/18   Wallis Bamberg, PA-C  fluticasone (FLONASE) 50 MCG/ACT nasal spray Place 1 spray into both nostrils daily. 12/12/19   Lesley Galentine, Veryl Speak, PA-C  Menthol (CEPACOL SORE THROAT) 5.4 MG LOZG Use as  directed 1 lozenge (5.4 mg total) in the mouth or throat every 2 (two) hours as needed. 12/12/19   Maisley Hainsworth, Veryl Speak, PA-C  Multiple Vitamin (MULTIVITAMIN) tablet Take 1 tablet by mouth daily.    [provider]  ondansetron (ZOFRAN) 4 MG tablet Take 1 tablet (4 mg total) by mouth every 6 (six) hours. 10/06/19   Moshe Cipro, NP  predniSONE (DELTASONE) 20 MG tablet Day 1-3: take 3 tablets daily. Day 4-6: take 2 tablets daily. Day 7-9: take 1 tablet daily. Take medication with breakfast. 11/27/18   Wallis Bamberg, PA-C  sodium chloride (OCEAN) 0.65 % SOLN nasal spray Place 1 spray into both nostrils as needed for congestion. 12/12/19   Rufus Beske, Veryl Speak, PA-C  cetirizine (ZYRTEC) 10 MG tablet Take 1 tablet (10 mg total) by mouth daily. Patient not taking: Reported on 12/21/2017 10/01/17 11/27/18  Belinda Fisher, PA-C  ipratropium (ATROVENT) 0.06 % nasal spray Place 2 sprays into both nostrils 4 (four) times daily. Patient not taking: Reported on 12/21/2017 10/01/17 11/27/18  Lurline Idol    Family History Family History  Problem Relation Age of Onset  . Hypertension Mother     Social History Social History   Tobacco Use  . Smoking status: Light Tobacco Smoker    Types: Cigarettes  . Smokeless tobacco: Never Used  Substance  Use Topics  . Alcohol use: Yes    Comment: socially  . Drug use: Yes    Types: Marijuana     Allergies   Latex   Review of Systems Review of Systems   Physical Exam Triage Vital Signs ED Triage Vitals  Enc Vitals Group     BP 12/12/19 1013 131/78     Pulse Rate 12/12/19 1013 80     Resp 12/12/19 1013 17     Temp 12/12/19 1013 98.7 F (37.1 C)     Temp Source 12/12/19 1013 Oral     SpO2 12/12/19 1013 99 %     Weight 12/12/19 1009 122 lb (55.3 kg)     Height --      Head Circumference --      Peak Flow --      Pain Score 12/12/19 1008 0     Pain Loc --      Pain Edu? --      Excl. in GC? --    No data found.  Updated Vital Signs BP 131/78 (BP  Location: Left Arm)   Pulse 80   Temp 98.7 F (37.1 C) (Oral)   Resp 17   Wt 122 lb (55.3 kg)   LMP 11/25/2019   SpO2 99%   BMI 22.31 kg/m   Visual Acuity Right Eye Distance:   Left Eye Distance:   Bilateral Distance:    Right Eye Near:   Left Eye Near:    Bilateral Near:     Physical Exam Vitals and nursing note reviewed.  Constitutional:      General: She is not in acute distress.    Appearance: She is well-developed. She is not ill-appearing.  HENT:     Head: Normocephalic and atraumatic.     Nose: Congestion and rhinorrhea present.     Mouth/Throat:     Mouth: Mucous membranes are moist.     Pharynx: Uvula midline. No posterior oropharyngeal erythema.     Tonsils: No tonsillar exudate or tonsillar abscesses. 0 on the right. 0 on the left.     Comments: Postnasal drip visible Eyes:     Conjunctiva/sclera: Conjunctivae normal.  Cardiovascular:     Rate and Rhythm: Normal rate and regular rhythm.     Heart sounds: No murmur heard.   Pulmonary:     Effort: Pulmonary effort is normal. No respiratory distress.     Breath sounds: Normal breath sounds. No wheezing, rhonchi or rales.  Abdominal:     Palpations: Abdomen is soft.     Tenderness: There is no abdominal tenderness. There is no right CVA tenderness or left CVA tenderness.  Musculoskeletal:     Cervical back: Neck supple.     Right lower leg: No edema.     Left lower leg: No edema.  Lymphadenopathy:     Cervical: No cervical adenopathy.  Skin:    General: Skin is warm and dry.  Neurological:     Mental Status: She is alert.      UC Treatments / Results  Labs (all labs ordered are listed, but only abnormal results are displayed) Labs Reviewed  POCT URINALYSIS DIPSTICK, ED / UC - Abnormal; Notable for the following components:      Result Value   Glucose, UA 100 (*)    Bilirubin Urine SMALL (*)    Ketones, ur 15 (*)    Hgb urine dipstick MODERATE (*)    Protein, ur 30 (*)    All other  components within normal limits  SARS CORONAVIRUS 2 (TAT 6-24 HRS)  CULTURE, GROUP A STREP [facility])  URINE CULTURE  POCT RAPID STREP A, ED / UC  POC URINE PREG, ED    EKG   Radiology No results found.  Procedures Procedures (including critical care time)  Medications Ordered in UC Medications - No data to display  Initial Impression / Assessment and Plan / UC Course  I have reviewed the triage vital signs and the nursing notes.  Pertinent labs & imaging results that were available during my care of the patient were reviewed by me and considered in my medical decision making (see chart for details).     #Viral illness #Abnormal urinalysis Patient is a 34 year old presenting with viral illness symptoms, found to have an abnormal urinalysis.  Will send urine culture, some consideration for possible passed kidney stone with regard to her low back pain.  Doubt infection as there are no other urinary symptoms and UA inconsistent with this.  Otherwise reassuring exam and vital signs today, no nausea or vomiting today with good appetite and keeping fluids down.  We will treat her symptomatically.  Covid sent.  Rapid strep was negative, culture sent.  Return, follow-up and emergency room precautions given.  Urged her to follow-up with primary care for repeat urinalysis in 2 weeks.  Patient verbalized understanding plan of care. Final Clinical Impressions(s) / UC Diagnoses   Final diagnoses:  Viral illness  Abnormal urinalysis     Discharge Instructions     Take medications as prescribed Drink plenty of water  Establish with a  primary care in, you should have a repeated urine test in 2 weeks  Return if not improving, if severe symptoms go to the Emergency department  If your Covid-19 test is positive, you will receive a phone call from [facility] regarding your results. Negative test results are not called. Both positive and negative results area always visible on MyChart. If  you do not have a MyChart account, sign up instructions are in your discharge papers.   Persons who are directed to care for themselves at home may discontinue isolation under the following conditions:  . At least 10 days have passed since symptom onset and . At least 24 hours have passed without running a fever (this means without the use of fever-reducing medications) and . Other symptoms have improved.  Persons infected with COVID-19 who never develop symptoms may discontinue isolation and other precautions 10 days after the date of their first positive COVID-19 test.       ED Prescriptions    Medication Sig Dispense Auth. Provider   acetaminophen (TYLENOL) 325 MG tablet Take 2 tablets (650 mg total) by mouth every 6 (six) hours as needed. 30 tablet Lucio Litsey, Veryl Speak, PA-C   Menthol (CEPACOL SORE THROAT) 5.4 MG LOZG Use as directed 1 lozenge (5.4 mg total) in the mouth or throat every 2 (two) hours as needed. 30 lozenge Verle Wheeling, Veryl Speak, PA-C   fluticasone (FLONASE) 50 MCG/ACT nasal spray Place 1 spray into both nostrils daily. 15.8 mL Dorris Pierre, Veryl Speak, PA-C   sodium chloride (OCEAN) 0.65 % SOLN nasal spray Place 1 spray into both nostrils as needed for congestion. 60 mL Naudia Crosley, Veryl Speak, PA-C     PDMP not reviewed this encounter.   Hermelinda Medicus, PA-C 12/12/19 1056

## 2019-12-12 NOTE — ED Triage Notes (Signed)
Pt is here with a sore throat,vomiting with body aches that started yesterday, pt has not taken any meds to relieve discomfort.

## 2019-12-13 LAB — URINE CULTURE

## 2019-12-13 LAB — SARS CORONAVIRUS 2 (TAT 6-24 HRS): SARS Coronavirus 2: NEGATIVE

## 2019-12-14 LAB — CULTURE, GROUP A STREP (THRC)

## 2021-02-15 ENCOUNTER — Ambulatory Visit (HOSPITAL_COMMUNITY)
Admission: EM | Admit: 2021-02-15 | Discharge: 2021-02-15 | Disposition: A | Discharge status: Home / Self Care | Payer: Self-pay | Attending: Physician Assistant | Admitting: Physician Assistant

## 2021-02-15 ENCOUNTER — Encounter (HOSPITAL_COMMUNITY): Payer: Self-pay | Admitting: Emergency Medicine

## 2021-02-15 ENCOUNTER — Other Ambulatory Visit: Payer: Self-pay

## 2021-02-15 DIAGNOSIS — J029 Acute pharyngitis, unspecified: Secondary | ICD-10-CM | POA: Insufficient documentation

## 2021-02-15 DIAGNOSIS — F17210 Nicotine dependence, cigarettes, uncomplicated: Secondary | ICD-10-CM | POA: Insufficient documentation

## 2021-02-15 DIAGNOSIS — R5381 Other malaise: Secondary | ICD-10-CM | POA: Insufficient documentation

## 2021-02-15 DIAGNOSIS — R051 Acute cough: Secondary | ICD-10-CM

## 2021-02-15 DIAGNOSIS — Z20822 Contact with and (suspected) exposure to covid-19: Secondary | ICD-10-CM | POA: Insufficient documentation

## 2021-02-15 DIAGNOSIS — J069 Acute upper respiratory infection, unspecified: Secondary | ICD-10-CM

## 2021-02-15 LAB — SARS CORONAVIRUS 2 (TAT 6-24 HRS): SARS Coronavirus 2: NEGATIVE

## 2021-02-15 LAB — POC INFLUENZA A AND B ANTIGEN (URGENT CARE ONLY)
INFLUENZA A ANTIGEN, POC: NEGATIVE
INFLUENZA B ANTIGEN, POC: NEGATIVE

## 2021-02-15 MED ORDER — PROMETHAZINE-DM 6.25-15 MG/5ML PO SYRP: 5.0000 mL | ORAL_SOLUTION | Freq: Three times a day (TID) | ORAL | 0 refills | Status: DC | PRN

## 2021-02-15 NOTE — Discharge Instructions (Signed)
Your flu test was negative.  We will contact you if your COVID test is positive.  Please remain out of work until you receive your results.  Today Promethazine DM up to 3 times a day as needed.  Do not drive or drink alcohol with this medication as a cause drowsiness.  Use Mucinex, Flonase, Tylenol for symptom relief.  If you have any worsening symptoms please return for reevaluation.

## 2021-02-15 NOTE — ED Provider Notes (Signed)
[facility]    CSN: [id number] Arrival date & time: 02/15/21  1042      History   Chief Complaint Chief Complaint  Patient presents with   Cough   Nasal Congestion    HPI Miranda Stephenson is a 35 y.o. female.   Patient presents today with 36-hour history of URI symptoms.  Reports nasal congestion, cough, sore throat, fatigue, malaise, headache, body aches.  Denies fever, nausea, vomiting.  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  She has not had a flu vaccine but is up-to-date on COVID-19 vaccinations.  Has not had COVID-19 in the past.  Denies history of allergies, asthma, COPD.  She does report occasionally smoking.  She has tried over-the-counter medications without improvement of symptoms.  Denies any recent antibiotic use.  Has missed work as result of symptoms.  Is requesting viral testing today per employer request.   Past Medical History:  Diagnosis Date   Herpes     There are no problems to display for this patient.   Past Surgical History:  Procedure Laterality Date   CESAREAN SECTION     FOOT SURGERY      OB History   No obstetric history on file.      Home Medications    Prior to Admission medications   Medication Sig Start Date End Date Taking? Authorizing Provider  promethazine-dextromethorphan (PROMETHAZINE-DM) 6.25-15 MG/5ML syrup Take 5 mLs by mouth 3 (three) times daily as needed for cough. 02/15/21  Yes Lachlan Pelto, Noberto Retort, PA-C  acetaminophen (TYLENOL) 325 MG tablet Take 2 tablets (650 mg total) by mouth every 6 (six) hours as needed. 12/12/19   Darr, Gerilyn Pilgrim, PA-C  cyclobenzaprine (FLEXERIL) 5 MG tablet Take 1 tablet (5 mg total) by mouth 3 (three) times daily as needed for muscle spasms. 11/27/18   Wallis Bamberg, PA-C  fluticasone (FLONASE) 50 MCG/ACT nasal spray Place 1 spray into both nostrils daily. 12/12/19   Darr, Gerilyn Pilgrim, PA-C  Menthol (CEPACOL SORE THROAT) 5.4 MG LOZG Use as directed 1  lozenge (5.4 mg total) in the mouth or throat every 2 (two) hours as needed. 12/12/19   Darr, Gerilyn Pilgrim, PA-C  Multiple Vitamin (MULTIVITAMIN) tablet Take 1 tablet by mouth daily.    [provider]  ondansetron (ZOFRAN) 4 MG tablet Take 1 tablet (4 mg total) by mouth every 6 (six) hours. 10/06/19   Moshe Cipro, NP  predniSONE (DELTASONE) 20 MG tablet Day 1-3: take 3 tablets daily. Day 4-6: take 2 tablets daily. Day 7-9: take 1 tablet daily. Take medication with breakfast. 11/27/18   Wallis Bamberg, PA-C  sodium chloride (OCEAN) 0.65 % SOLN nasal spray Place 1 spray into both nostrils as needed for congestion. 12/12/19   Darr, Gerilyn Pilgrim, PA-C  cetirizine (ZYRTEC) 10 MG tablet Take 1 tablet (10 mg total) by mouth daily. Patient not taking: Reported on 12/21/2017 10/01/17 11/27/18  Belinda Fisher, PA-C  ipratropium (ATROVENT) 0.06 % nasal spray Place 2 sprays into both nostrils 4 (four) times daily. Patient not taking: Reported on 12/21/2017 10/01/17 11/27/18  Lurline Idol    Family History Family History  Problem Relation Age of Onset   Hypertension Mother     Social History Social History   Tobacco Use   Smoking status: Light Smoker    Types: Cigarettes   Smokeless tobacco: Never  Substance Use Topics   Alcohol use: Yes    Comment: socially   Drug use:  Yes    Types: Marijuana     Allergies   Latex   Review of Systems Review of Systems  Constitutional:  Positive for activity change and fatigue. Negative for appetite change and fever.  HENT:  Positive for congestion and sore throat. Negative for sinus pressure and sneezing.   Respiratory:  Positive for cough. Negative for shortness of breath.   Cardiovascular:  Negative for chest pain.  Gastrointestinal:  Negative for abdominal pain, diarrhea, nausea and vomiting.  Musculoskeletal:  Positive for arthralgias and myalgias.  Neurological:  Positive for headaches. Negative for dizziness and light-headedness.    Physical Exam Triage  Vital Signs ED Triage Vitals [02/15/21 1233]  Enc Vitals Group     BP 108/71     Pulse Rate 77     Resp 17     Temp 98.1 F (36.7 C)     Temp Source Oral     SpO2 98 %     Weight      Height      Head Circumference      Peak Flow      Pain Score      Pain Loc      Pain Edu?      Excl. in GC?    No data found.  Updated Vital Signs BP 108/71 (BP Location: Right Arm)   Pulse 77   Temp 98.1 F (36.7 C) (Oral)   Resp 17   LMP 02/14/2021   SpO2 98%   Visual Acuity Right Eye Distance:   Left Eye Distance:   Bilateral Distance:    Right Eye Near:   Left Eye Near:    Bilateral Near:     Physical Exam Vitals reviewed.  Constitutional:      General: She is awake. She is not in acute distress.    Appearance: Normal appearance. She is well-developed. She is not ill-appearing.     Comments: Very pleasant female appears in age no acute distress sitting comfortably in exam room  HENT:     Head: Normocephalic and atraumatic.     Right Ear: Tympanic membrane, ear canal and external ear normal. Tympanic membrane is not erythematous or bulging.     Left Ear: Tympanic membrane, ear canal and external ear normal. Tympanic membrane is not erythematous or bulging.     Nose:     Right Sinus: No maxillary sinus tenderness or frontal sinus tenderness.     Left Sinus: No maxillary sinus tenderness or frontal sinus tenderness.     Mouth/Throat:     Pharynx: Uvula midline. Posterior oropharyngeal erythema present. No oropharyngeal exudate.  Cardiovascular:     Rate and Rhythm: Normal rate and regular rhythm.     Heart sounds: Normal heart sounds, S1 normal and S2 normal. No murmur heard. Pulmonary:     Effort: Pulmonary effort is normal.     Breath sounds: Normal breath sounds. No wheezing, rhonchi or rales.     Comments: Clear to auscultation bilaterally Psychiatric:        Behavior: Behavior is cooperative.     UC Treatments / Results  Labs (all labs ordered are listed, but  only abnormal results are displayed) Labs Reviewed  SARS CORONAVIRUS 2 (TAT 6-24 HRS)  POC INFLUENZA A AND B ANTIGEN (URGENT CARE ONLY)    EKG   Radiology No results found.  Procedures Procedures (including critical care time)  Medications Ordered in UC Medications - No data to display  Initial Impression / Assessment and  Plan / UC Course  I have reviewed the triage vital signs and the nursing notes.  Pertinent labs & imaging results that were available during my care of the patient were reviewed by me and considered in my medical decision making (see chart for details).     Flu test was obtained today and was negative.  COVID test is pending.  Discussed likely viral nature of his symptoms given clinical presentation.  Patient was encouraged to use over-the-counter medications including Tylenol, Mucinex, Flonase for symptom relief.  She was prescribed Promethazine DM for cough with instruction not to drive or drink alcohol with this medication as drowsiness is a common side effect.  Discussed alarm symptoms that warrant emergent evaluation.  Strict return precautions given to which she expressed understanding.  She was provided work excuse note with current CDC return to work guidelines based on COVID test result.  Final Clinical Impressions(s) / UC Diagnoses   Final diagnoses:  Upper respiratory tract infection, unspecified type  Acute cough     Discharge Instructions      Your flu test was negative.  We will contact you if your COVID test is positive.  Please remain out of work until you receive your results.  Today Promethazine DM up to 3 times a day as needed.  Do not drive or drink alcohol with this medication as a cause drowsiness.  Use Mucinex, Flonase, Tylenol for symptom relief.  If you have any worsening symptoms please return for reevaluation.     ED Prescriptions     Medication Sig Dispense Auth. Provider   promethazine-dextromethorphan (PROMETHAZINE-DM)  6.25-15 MG/5ML syrup Take 5 mLs by mouth 3 (three) times daily as needed for cough. 118 mL Sanaii Caporaso K, PA-C      PDMP not reviewed this encounter.   Jeani Hawking, PA-C 02/15/21 1403

## 2021-02-15 NOTE — ED Triage Notes (Signed)
Pt reports congestion, cough, sore throat since yesterday.

## 2021-02-16 ENCOUNTER — Telehealth (HOSPITAL_COMMUNITY): Payer: Self-pay | Admitting: Emergency Medicine

## 2021-02-16 MED ORDER — PROMETHAZINE-DM 6.25-15 MG/5ML PO SYRP
5.0000 mL | ORAL_SOLUTION | Freq: Three times a day (TID) | ORAL | 0 refills | Status: DC | PRN
Start: 2021-02-16 — End: 2021-12-29

## 2021-02-16 NOTE — Telephone Encounter (Signed)
Patient left voicemail expressing frustration that her medication was not at pharmacy.  On prescription this RN notes "receipt confirmed by pharmacy".  When I called pharmacy this morning, they state the do not have it on file.  Resending.

## 2021-12-29 ENCOUNTER — Other Ambulatory Visit: Payer: Self-pay

## 2021-12-29 ENCOUNTER — Ambulatory Visit (HOSPITAL_COMMUNITY)
Admission: EM | Admit: 2021-12-29 | Discharge: 2021-12-29 | Disposition: A | Discharge status: Home / Self Care | Payer: Commercial Managed Care - HMO | Attending: Family Medicine | Admitting: Family Medicine

## 2021-12-29 ENCOUNTER — Encounter (HOSPITAL_COMMUNITY): Payer: Self-pay | Admitting: Emergency Medicine

## 2021-12-29 ENCOUNTER — Ambulatory Visit (INDEPENDENT_AMBULATORY_CARE_PROVIDER_SITE_OTHER): Payer: Commercial Managed Care - HMO

## 2021-12-29 DIAGNOSIS — Z3202 Encounter for pregnancy test, result negative: Secondary | ICD-10-CM

## 2021-12-29 DIAGNOSIS — R109 Unspecified abdominal pain: Secondary | ICD-10-CM

## 2021-12-29 DIAGNOSIS — R1032 Left lower quadrant pain: Secondary | ICD-10-CM | POA: Diagnosis present

## 2021-12-29 DIAGNOSIS — N390 Urinary tract infection, site not specified: Secondary | ICD-10-CM | POA: Diagnosis present

## 2021-12-29 LAB — POCT URINALYSIS DIPSTICK, ED / UC
Bilirubin Urine: NEGATIVE
Glucose, UA: NEGATIVE mg/dL
Ketones, ur: NEGATIVE mg/dL
Nitrite: NEGATIVE
Protein, ur: NEGATIVE mg/dL
Specific Gravity, Urine: 1.02 (ref 1.005–1.030)
Urobilinogen, UA: 1 mg/dL (ref 0.0–1.0)
pH: 7 (ref 5.0–8.0)

## 2021-12-29 LAB — POC URINE PREG, ED: Preg Test, Ur: NEGATIVE

## 2021-12-29 MED ORDER — SULFAMETHOXAZOLE-TRIMETHOPRIM 800-160 MG PO TABS: 1.0000 | ORAL_TABLET | Freq: Two times a day (BID) | ORAL | 0 refills | Status: DC

## 2021-12-29 NOTE — ED Notes (Signed)
Labeled specimen at bedside.  Urine placed in lab

## 2021-12-29 NOTE — ED Triage Notes (Signed)
Last night had abdominal pain and diarrhea.  Patient is concerned there is a knot in left abdomen that a friend palpated.  Reports 4 episodes of diarrhea today.  Denies vomiting.

## 2021-12-29 NOTE — Discharge Instructions (Signed)
Your were seen today for abdominal pain and diarrhea.  Your urine does show possible infection.  I have sent out an antibiotic to treat this today and will culture the urine.  Your pregnancy test was negative.  Your xray did not show any obvious kidney stone today.  I recommend you take the antibiotic, and get plenty of fluids.   If your pain worsens, or if you develop vomiting, worsening diarrhea then please go to the ER for evaluation.

## 2021-12-29 NOTE — ED Provider Notes (Signed)
[facility]    CSN: [id number] Arrival date & time: 12/29/21  1038      History   Chief Complaint Chief Complaint  Patient presents with   Abdominal Pain    HPI Miranda Stephenson is a 36 y.o. female.   Patient is here for abd pain that started last evening.  Pain is at the left lower abdomen.  Pain comes/goes.  This started last night at 7pm.  It did wake her from sleep.  She then started with diarrhea.  No blood in her stool.  + nausea, no vomiting.  She feels hot/chills, but no known fevers per se.  Slight burning with urination since yesterday.  Her friend noted a "knot" in her lower abdomen.  She just finished her period this week.   She has h/o abdominal pain, usually with her period, but this is without her period.  Also has a h/o kidney stones.        Past Medical History:  Diagnosis Date   Herpes     There are no problems to display for this patient.   Past Surgical History:  Procedure Laterality Date   CESAREAN SECTION     FOOT SURGERY      OB History   No obstetric history on file.      Home Medications    Prior to Admission medications   Medication Sig Start Date End Date Taking? Authorizing Provider  acetaminophen (TYLENOL) 325 MG tablet Take 2 tablets (650 mg total) by mouth every 6 (six) hours as needed. 12/12/19   Darr, Gerilyn Pilgrim, PA-C  cyclobenzaprine (FLEXERIL) 5 MG tablet Take 1 tablet (5 mg total) by mouth 3 (three) times daily as needed for muscle spasms. Patient not taking: Reported on 12/29/2021 11/27/18   Wallis Bamberg, PA-C  fluticasone [facility]) 50 MCG/ACT nasal spray Place 1 spray into both nostrils daily. Patient not taking: Reported on 12/29/2021 12/12/19   Darr, Gerilyn Pilgrim, PA-C  Menthol (CEPACOL SORE THROAT) 5.4 MG LOZG Use as directed 1 lozenge (5.4 mg total) in the mouth or throat every 2 (two) hours as needed. Patient not taking: Reported on 12/29/2021 12/12/19   Darr, Gerilyn Pilgrim, PA-C  Multiple Vitamin (MULTIVITAMIN) tablet Take 1  tablet by mouth daily. Patient not taking: Reported on 12/29/2021    [provider]  ondansetron (ZOFRAN) 4 MG tablet Take 1 tablet (4 mg total) by mouth every 6 (six) hours. Patient not taking: Reported on 12/29/2021 10/06/19   Moshe Cipro, NP  predniSONE (DELTASONE) 20 MG tablet Day 1-3: take 3 tablets daily. Day 4-6: take 2 tablets daily. Day 7-9: take 1 tablet daily. Take medication with breakfast. Patient not taking: Reported on 12/29/2021 11/27/18   Wallis Bamberg, PA-C  promethazine-dextromethorphan (PROMETHAZINE-DM) 6.25-15 MG/5ML syrup Take 5 mLs by mouth 3 (three) times daily as needed for cough. Patient not taking: Reported on 12/29/2021 02/16/21   Merrilee Jansky, MD  sodium chloride (OCEAN) 0.65 % SOLN nasal spray Place 1 spray into both nostrils as needed for congestion. Patient not taking: Reported on 12/29/2021 12/12/19   Darr, Gerilyn Pilgrim, PA-C  cetirizine (ZYRTEC) 10 MG tablet Take 1 tablet (10 mg total) by mouth daily. Patient not taking: Reported on 12/21/2017 10/01/17 11/27/18  Belinda Fisher, PA-C  ipratropium (ATROVENT) 0.06 % nasal spray Place 2 sprays into both nostrils 4 (four) times daily. Patient not taking: Reported on 12/21/2017 10/01/17 11/27/18  Lurline Idol    Family History Family History  Problem Relation Age of Onset  Hypertension Mother     Social History Social History   Tobacco Use   Smoking status: Light Smoker    Types: Cigarettes   Smokeless tobacco: Never  Vaping Use   Vaping Use: Never used  Substance Use Topics   Alcohol use: Yes    Comment: socially   Drug use: Yes    Types: Marijuana     Allergies   Latex   Review of Systems Review of Systems  Constitutional:  Positive for activity change and chills. Negative for fever.  HENT: Negative.    Respiratory: Negative.    Cardiovascular: Negative.   Gastrointestinal:  Positive for abdominal pain and diarrhea.  Genitourinary:  Positive for dysuria.  Musculoskeletal: Negative.   Skin:  Negative.   Psychiatric/Behavioral: Negative.       Physical Exam Triage Vital Signs ED Triage Vitals  Enc Vitals Group     BP 12/29/21 1137 (!) 99/58     Pulse Rate 12/29/21 1137 72     Resp 12/29/21 1137 18     Temp 12/29/21 1137 97.8 F (36.6 C)     Temp Source 12/29/21 1137 Oral     SpO2 12/29/21 1137 100 %     Weight --      Height --      Head Circumference --      Peak Flow --      Pain Score 12/29/21 1133 7     Pain Loc --      Pain Edu? --      Excl. in GC? --    No data found.  Updated Vital Signs BP (!) 99/58 (BP Location: Left Arm)   Pulse 72   Temp 97.8 F (36.6 C) (Oral)   Resp 18   LMP 12/22/2021   SpO2 100%   Visual Acuity Right Eye Distance:   Left Eye Distance:   Bilateral Distance:    Right Eye Near:   Left Eye Near:    Bilateral Near:     Physical Exam Constitutional:      Appearance: She is well-developed.  Cardiovascular:     Rate and Rhythm: Normal rate and regular rhythm.  Pulmonary:     Effort: Pulmonary effort is normal.     Breath sounds: Normal breath sounds.  Abdominal:     General: Bowel sounds are normal.     Palpations: Abdomen is soft.     Tenderness: There is abdominal tenderness in the suprapubic area.  Skin:    General: Skin is warm.  Neurological:     General: No focal deficit present.     Mental Status: She is alert.  Psychiatric:        Mood and Affect: Mood normal.      UC Treatments / Results  Labs (all labs ordered are listed, but only abnormal results are displayed) Labs Reviewed  POCT URINALYSIS DIPSTICK, ED / UC - Abnormal; Notable for the following components:      Result Value   Hgb urine dipstick SMALL (*)    Leukocytes,Ua SMALL (*)    All other components within normal limits  POC URINE PREG, ED    EKG   Radiology DG Abd 1 View  Result Date: 12/29/2021 CLINICAL DATA:  Abdominal pain EXAM: ABDOMEN - 1 VIEW COMPARISON:  None Available. FINDINGS: Bowel gas pattern is nonspecific. Small  amount of stool is seen in colon. There is no fecal impaction in rectosigmoid. No abnormal masses or calcifications are seen. Left kidney is partly  obscured by bowel contents. Bony structures are unremarkable. Visualized lower lung fields are clear. IMPRESSION: Nonspecific bowel gas pattern.  Small stool burden in colon. Electronically Signed   By: Ernie Avena M.D.   On: 12/29/2021 12:22    Procedures Procedures (including critical care time)  Medications Ordered in UC Medications - No data to display  Initial Impression / Assessment and Plan / UC Course  I have reviewed the triage vital signs and the nursing notes.  Pertinent labs & imaging results that were available during my care of the patient were reviewed by me and considered in my medical decision making (see chart for details).   Final Clinical Impressions(s) / UC Diagnoses   Final diagnoses:  Left lower quadrant abdominal pain  Urinary tract infection without hematuria, site unspecified     Discharge Instructions      Your were seen today for abdominal pain and diarrhea.  Your urine does show possible infection.  I have sent out an antibiotic to treat this today and will culture the urine.  Your pregnancy test was negative.  Your xray did not show any obvious kidney stone today.  I recommend you take the antibiotic, and get plenty of fluids.   If your pain worsens, or if you develop vomiting, worsening diarrhea then please go to the ER for evaluation.     ED Prescriptions     Medication Sig Dispense Auth. Provider   sulfamethoxazole-trimethoprim (BACTRIM DS) 800-160 MG tablet Take 1 tablet by mouth 2 (two) times daily for 5 days. 10 tablet Jannifer Franklin, MD      PDMP not reviewed this encounter.   Jannifer Franklin, MD 12/29/21 1233

## 2021-12-30 LAB — URINE CULTURE

## 2022-01-02 ENCOUNTER — Telehealth (HOSPITAL_COMMUNITY): Payer: Self-pay | Admitting: Emergency Medicine

## 2022-01-02 MED ORDER — SULFAMETHOXAZOLE-TRIMETHOPRIM 800-160 MG PO TABS: 1.0000 | ORAL_TABLET | Freq: Two times a day (BID) | ORAL | 0 refills | Status: AC

## 2022-01-02 NOTE — Telephone Encounter (Signed)
Original prescription did not go through to the pharmacy.  Patient did not have a profile on file with the same address and it did not link.  Resending now
